# Patient Record
Sex: Male | Born: 1965 | Race: Black or African American | Hispanic: No | Marital: Married | Smoking: Never smoker
Health system: Southern US, Community
[De-identification: ages and names within clinical notes are randomized; demographics above are authoritative.]

## PROBLEM LIST (undated history)

## (undated) DIAGNOSIS — J45909 Unspecified asthma, uncomplicated: Secondary | ICD-10-CM

---

## 2004-12-31 ENCOUNTER — Emergency Department: Payer: Self-pay | Admitting: Emergency Medicine

## 2007-06-25 ENCOUNTER — Emergency Department: Payer: Self-pay | Admitting: Emergency Medicine

## 2008-04-20 ENCOUNTER — Emergency Department: Payer: Self-pay | Admitting: Emergency Medicine

## 2009-03-31 ENCOUNTER — Emergency Department: Payer: Self-pay | Admitting: Emergency Medicine

## 2011-05-12 ENCOUNTER — Emergency Department: Payer: Self-pay | Admitting: Emergency Medicine

## 2012-11-16 ENCOUNTER — Emergency Department: Payer: Self-pay | Admitting: Emergency Medicine

## 2014-09-10 ENCOUNTER — Emergency Department
Admit: 2014-09-10 | Disposition: A | Discharge status: Home / Self Care | Payer: Self-pay | Admitting: Emergency Medicine

## 2014-09-10 LAB — CBC WITH DIFFERENTIAL/PLATELET
Basophil #: 0.1 10*3/uL (ref 0.0–0.1)
Basophil %: 0.9 %
EOS ABS: 0.1 10*3/uL (ref 0.0–0.7)
Eosinophil %: 0.6 %
HCT: 52.3 % — ABNORMAL HIGH (ref 40.0–52.0)
HGB: 17.5 g/dL (ref 13.0–18.0)
LYMPHS ABS: 2.4 10*3/uL (ref 1.0–3.6)
Lymphocyte %: 22.3 %
MCH: 29.5 pg (ref 26.0–34.0)
MCHC: 33.5 g/dL (ref 32.0–36.0)
MCV: 88 fL (ref 80–100)
MONO ABS: 1.1 x10 3/mm — AB (ref 0.2–1.0)
Monocyte %: 10 %
Neutrophil #: 7.3 10*3/uL — ABNORMAL HIGH (ref 1.4–6.5)
Neutrophil %: 66.2 %
PLATELETS: 174 10*3/uL (ref 150–440)
RBC: 5.92 10*6/uL — ABNORMAL HIGH (ref 4.40–5.90)
RDW: 14 % (ref 11.5–14.5)
WBC: 11 10*3/uL — AB (ref 3.8–10.6)

## 2014-09-10 LAB — URINALYSIS, COMPLETE
BACTERIA: NONE SEEN
BILIRUBIN, UR: NEGATIVE
Blood: NEGATIVE
GLUCOSE, UR: NEGATIVE mg/dL (ref 0–75)
Ketone: NEGATIVE
LEUKOCYTE ESTERASE: NEGATIVE
Nitrite: NEGATIVE
PH: 5 (ref 4.5–8.0)
Protein: NEGATIVE
RBC,UR: 3 /HPF (ref 0–5)
Specific Gravity: 1.024 (ref 1.003–1.030)
WBC UR: 1 /HPF (ref 0–5)

## 2014-09-10 LAB — COMPREHENSIVE METABOLIC PANEL
ALBUMIN: 4.3 g/dL
Alkaline Phosphatase: 70 U/L
Anion Gap: 8 (ref 7–16)
BUN: 15 mg/dL
Bilirubin,Total: 0.5 mg/dL
Calcium, Total: 9 mg/dL
Chloride: 102 mmol/L
Co2: 26 mmol/L
Creatinine: 1.03 mg/dL
EGFR (African American): >60
EGFR (Non-African Amer.): >60
GLUCOSE: 134 mg/dL — AB
Potassium: 3.6 mmol/L
SGOT(AST): 24 U/L
SGPT (ALT): 21 U/L
SODIUM: 136 mmol/L
TOTAL PROTEIN: 7.3 g/dL

## 2014-09-10 LAB — TROPONIN I: Troponin-I: <0.03 ng/mL

## 2014-09-10 LAB — LIPASE, BLOOD: LIPASE: 57 U/L — AB

## 2016-06-03 ENCOUNTER — Emergency Department: Payer: Self-pay

## 2016-06-03 ENCOUNTER — Emergency Department
Admission: EM | Admit: 2016-06-03 | Discharge: 2016-06-03 | Disposition: A | Discharge status: Home / Self Care | Payer: Self-pay | Attending: Emergency Medicine | Admitting: Emergency Medicine

## 2016-06-03 ENCOUNTER — Encounter: Payer: Self-pay | Admitting: Emergency Medicine

## 2016-06-03 DIAGNOSIS — J45909 Unspecified asthma, uncomplicated: Secondary | ICD-10-CM | POA: Insufficient documentation

## 2016-06-03 DIAGNOSIS — J209 Acute bronchitis, unspecified: Secondary | ICD-10-CM | POA: Insufficient documentation

## 2016-06-03 HISTORY — DX: Unspecified asthma, uncomplicated: J45.909

## 2016-06-03 MED ORDER — ALBUTEROL SULFATE HFA 108 (90 BASE) MCG/ACT IN AERS: 2.0000 | INHALATION_SPRAY | Freq: Four times a day (QID) | RESPIRATORY_TRACT | 2 refills | Status: DC | PRN

## 2016-06-03 MED ORDER — IPRATROPIUM-ALBUTEROL 0.5-2.5 (3) MG/3ML IN SOLN
3.0000 mL | Freq: Once | RESPIRATORY_TRACT | Status: AC
  Administered 2016-06-03: 3 mL via RESPIRATORY_TRACT
  Filled 2016-06-03: qty 3

## 2016-06-03 MED ORDER — AZITHROMYCIN 250 MG PO TABS: ORAL_TABLET | ORAL | 0 refills | Status: AC

## 2016-06-03 NOTE — ED Triage Notes (Signed)
Patient c/o cough, congestion, itchy eyes, and runny nose x 1 week. Patient states that he has tried OTC medications without relief. Patient denies fever or chills.

## 2016-06-03 NOTE — ED Notes (Signed)
Pt reports cough sxs for the past week and a half, productive cough, nasal drainage, watering eyes. Denies any fevers. Using OTC medications without relief.

## 2016-06-03 NOTE — ED Notes (Signed)
Patient transported to X-ray 

## 2016-06-03 NOTE — ED Provider Notes (Signed)
[facility] Emergency Department Provider Note  ____________________________________________  Time seen: Approximately 2:51 PM  I have reviewed the triage vital signs and the nursing notes.   HISTORY  Chief Complaint Cough   HPI Hector MorgansMarvin S Robbins is a 50 y.o. male with a history of asthma presenting to the emergency department with productive cough for the past week. Patient utilizes albuterol PRN for asthma. Additional symptoms include purulent sputum production with cough, congestion and clear rhinorrhea. Patient states that he has been occasionally short of breath and cough has kept him awake at night. He denies fever. Denies chest pain, nausea, vomiting, abdominal pain, dysuria and hematuria. He is out of albuterol. No recent travel. Patient works as an Journalist, newspaperauto mechanic.   Past Medical History:  Diagnosis Date  . Asthma     There are no active problems to display for this patient.   No past surgical history on file.  Prior to Admission medications   Medication Sig Start Date End Date Taking? Authorizing Provider  albuterol (PROVENTIL HFA;VENTOLIN HFA) 108 (90 Base) MCG/ACT inhaler Inhale 2 puffs into the lungs every 6 (six) hours as needed for wheezing or shortness of breath. 06/03/16   Orvil FeilJaclyn M Flois [place], PA-C  azithromycin (ZITHROMAX Z-PAK) 250 MG tablet Take 2 tablets (500 mg) on  Day 1,  followed by 1 tablet (250 mg) once daily on Days 2 through 5. 06/03/16 06/08/16  Orvil FeilJaclyn M Yuko [place], PA-C    Allergies Patient has no known allergies.  No family history on file.  Social History Social History  Substance Use Topics  . Smoking status: Never Smoker  . Smokeless tobacco: Never Used  . Alcohol use No     Review of Systems  Constitutional: No fever/chills Eyes: No visual changes. No discharge ENT: Patient has productive cough, purulent sputum production and congestion. Cardiovascular: no chest pain. Respiratory: He has been wheezing. Gastrointestinal:  No abdominal pain.  No nausea, no vomiting.  No diarrhea.  No constipation. Musculoskeletal: Negative for musculoskeletal pain. Skin: Negative for rash, abrasions, lacerations, ecchymosis. Neurological: Negative for headaches, focal weakness or numbness. 10-point ROS otherwise negative.  ____________________________________________   PHYSICAL EXAM:  VITAL SIGNS: ED Triage Vitals  Enc Vitals Group     BP 06/03/16 1348 138/78     Pulse Rate 06/03/16 1348 89     Resp 06/03/16 1348 16     Temp 06/03/16 1348 98.3 F (36.8 C)     Temp Source 06/03/16 1348 Oral     SpO2 06/03/16 1348 97 %     Weight 06/03/16 1349 265 lb (120.2 kg)     Height 06/03/16 1349 5\' 9"  (1.753 m)     Head Circumference --      Peak Flow --      Pain Score --      Pain Loc --      Pain Edu? --      Excl. in GC? --      Constitutional: Alert and oriented. Well appearing and in no acute distress. Eyes: Conjunctivae are normal. PERRL. EOMI. Head: Atraumatic. ENT:      Ears: Tympanic membranes are pearly bilaterally without erythema or purulent exudate. Bony landmarks visualized bilaterally.      Nose: Nasal turbinates are edematous.      Mouth/Throat: Mucous membranes are moist. Posterior pharynx is without erythema, tonsillar hypertrophy or tonsillar exudate.  Neck: No stridor. FROM.  Cardiovascular: Normal rate, regular rhythm. Normal S1 and S2.  Good peripheral circulation. Respiratory: Normal  respiratory effort without tachypnea or retractions. Patient has wheezing auscultated in the right lung base. Wheezing resolved to auscultation after DuoNeb. Skin:  Skin is warm, dry and intact. No rash noted. Psychiatric: Mood and affect are normal. Speech and behavior are normal. Patient exhibits appropriate insight and judgement.   ____________________________________________   LABS (all labs ordered are listed, but only abnormal results are displayed)  Labs Reviewed - No data to  display ____________________________________________  EKG   ____________________________________________  RADIOLOGY Geraldo PitterI, Emeli Goguen M Maverick Dieudonne, personally viewed and evaluated these images (plain radiographs) as part of my medical decision making, as well as reviewing the written report by the radiologist.  Dg Chest 2 View  Result Date: 06/03/2016 CLINICAL DATA:  Productive cough with congestion and dizziness for 1 week. Social smoker. EXAM: CHEST  2 VIEW COMPARISON:  04/20/2008. FINDINGS: The heart size and mediastinal contours are normal. The lungs are clear. There is no pleural effusion or pneumothorax. No acute osseous findings are identified. Stable degenerative changes within the thoracic spine. IMPRESSION: Stable chest.  No active cardiopulmonary process. Electronically Signed   By: Carey BullocksWilliam  Veazey M.D.   On: 06/03/2016 15:12    ____________________________________________    PROCEDURES  Procedure(s) performed:    Procedures  Medications  ipratropium-albuterol (DUONEB) 0.5-2.5 (3) MG/3ML nebulizer solution 3 mL (3 mLs Nebulization Given 06/03/16 1505)     ____________________________________________   INITIAL IMPRESSION / ASSESSMENT AND PLAN / ED COURSE  Pertinent labs & imaging results that were available during my care of the patient were reviewed by me and considered in my medical decision making (see chart for details).  Review of the [place] CSRS was performed in accordance of the [facility] prior to dispensing any controlled drugs.  Clinical Course     Assessment and plan:  Acute Bronchitis Patient has had a productive cough with purulent sputum production for the past week. DG chest does not reveal consolidations or findings consistent with pneumonia. Right lung base wheezing improved to auscultation after DuoNeb treatment. Patient was discharged with azithromycin and albuterol. Patient education was provided regarding albuterol use. Patient was advised to follow-up with his  primary care provider in one week. Vital signs are reassuring at this time. All patient questions were answered.  __________________________________________  FINAL CLINICAL IMPRESSION(S) / ED DIAGNOSES  Final diagnoses:  Acute bronchitis, unspecified organism      NEW MEDICATIONS STARTED DURING THIS VISIT:  Discharge Medication List as of 06/03/2016  3:34 PM    START taking these medications   Details  azithromycin (ZITHROMAX Z-PAK) 250 MG tablet Take 2 tablets (500 mg) on  Day 1,  followed by 1 tablet (250 mg) once daily on Days 2 through 5., Print            This chart was dictated using voice recognition software/Dragon. Despite best efforts to proofread, errors can occur which can change the meaning. Any change was purely unintentional.    Orvil FeilJaclyn M Cuong Moorman, PA-C 06/03/16 1722    Jene Everyobert Kinner, MD 06/05/16 [phone]

## 2016-07-24 ENCOUNTER — Encounter: Payer: Self-pay | Admitting: Emergency Medicine

## 2016-07-24 ENCOUNTER — Emergency Department
Admission: EM | Admit: 2016-07-24 | Discharge: 2016-07-24 | Disposition: A | Discharge status: Home / Self Care | Payer: Self-pay | Attending: Emergency Medicine | Admitting: Emergency Medicine

## 2016-07-24 ENCOUNTER — Emergency Department: Payer: Self-pay

## 2016-07-24 DIAGNOSIS — J45909 Unspecified asthma, uncomplicated: Secondary | ICD-10-CM | POA: Insufficient documentation

## 2016-07-24 DIAGNOSIS — N50812 Left testicular pain: Secondary | ICD-10-CM

## 2016-07-24 DIAGNOSIS — N451 Epididymitis: Secondary | ICD-10-CM | POA: Insufficient documentation

## 2016-07-24 DIAGNOSIS — Z79899 Other long term (current) drug therapy: Secondary | ICD-10-CM | POA: Insufficient documentation

## 2016-07-24 LAB — URINALYSIS, COMPLETE (UACMP) WITH MICROSCOPIC
BACTERIA UA: NONE SEEN
BILIRUBIN URINE: NEGATIVE
Glucose, UA: NEGATIVE mg/dL
Hgb urine dipstick: NEGATIVE
KETONES UR: NEGATIVE mg/dL
Nitrite: NEGATIVE
PH: 8 (ref 5.0–8.0)
PROTEIN: NEGATIVE mg/dL
SQUAMOUS EPITHELIAL / LPF: NONE SEEN
Specific Gravity, Urine: 1.017 (ref 1.005–1.030)

## 2016-07-24 MED ORDER — IBUPROFEN 800 MG PO TABS
800.0000 mg | ORAL_TABLET | ORAL | Status: AC
  Administered 2016-07-24: 800 mg via ORAL
  Filled 2016-07-24: qty 1

## 2016-07-24 MED ORDER — LEVOFLOXACIN 500 MG PO TABS: 500.0000 mg | ORAL_TABLET | Freq: Every day | ORAL | 0 refills | Status: AC

## 2016-07-24 MED ORDER — LEVOFLOXACIN 500 MG PO TABS
500.0000 mg | ORAL_TABLET | Freq: Once | ORAL | Status: AC
  Administered 2016-07-24: 500 mg via ORAL
  Filled 2016-07-24: qty 1

## 2016-07-24 NOTE — Discharge Instructions (Signed)
You have been seen in the Emergency Department (ED) today for pain when urinating.  Your workup today suggests that you have a urinary tract infection (UTI).  Please take your antibiotic as prescribed and over-the-counter pain medication (Tylenol or Motrin) as needed, but no more than recommended on the label instructions.  Drink PLENTY of fluids.  Call your regular doctor or Urology to schedule the next available appointment to follow up on today?s ED visit, or return immediately to the ED if your pain worsens, you have decreased urine production, develop fever, persistent vomiting, severe pain or swelling in the testicles, or other symptoms that concern you.

## 2016-07-24 NOTE — ED Provider Notes (Signed)
[facility] Emergency Department Provider Note  ____________________________________________   First MD Initiated Contact with Patient 07/24/16 1709     (approximate)  I have reviewed the triage vital signs and the nursing notes.   HISTORY  Chief Complaint Testicle Pain   HPI Hector MorgansMarvin S Robbins is a 51 y.o. male reports no significant medical history of an asthma  Yesterday no slight discomfort along the back of his left testicle, then when he woke up this morning he noticed a appreciable increase in pain, the point was difficulty to bed because of pain at the base of the left testicle. However, after taking 2 Profen tablets at home his pain started to ease up but he knows he is feeling like he had a slight temperature. He is also noticed yesterday when he was urinating that he had kind of a strange burning feeling when he urinated.  No nausea vomiting. No chest pain or shortness of breath. Reports he is concerned that he might have some difficulty with being at work the next couple days because of pain in the testicle. No groin swelling. The left testicle has looked slightly swollen  Sexual active only with one partner for many years. Denies any history of STDs   Past Medical History:  Diagnosis Date  . Asthma     There are no active problems to display for this patient.   History reviewed. No pertinent surgical history.  Prior to Admission medications   Medication Sig Start Date End Date Taking? Authorizing Provider  albuterol (PROVENTIL HFA;VENTOLIN HFA) 108 (90 Base) MCG/ACT inhaler Inhale 2 puffs into the lungs every 6 (six) hours as needed for wheezing or shortness of breath. 06/03/16   Orvil FeilJaclyn M [place], PA-C  levofloxacin (LEVAQUIN) 500 MG tablet Take 1 tablet (500 mg total) by mouth daily. 07/24/16 08/08/16  Sharyn CreamerMark Quale, MD    Allergies Patient has no known allergies.  No family history on file.  Social History Social History  Substance Use  Topics  . Smoking status: Never Smoker  . Smokeless tobacco: Never Used  . Alcohol use No    Review of Systems Constitutional: Feeling a slight fever, no chills Eyes: No visual changes. ENT: No sore throat. Cardiovascular: Denies chest pain. Respiratory: Denies shortness of breath. Gastrointestinal: No abdominal pain.  No nausea, no vomiting.   Genitourinary: See history of present illness Musculoskeletal: Negative for back pain. Skin: Negative for rash. Neurological: Negative for headaches, focal weakness or numbness.  No pain in the buttock. No pain with defecation.  10-point ROS otherwise negative.  ____________________________________________   PHYSICAL EXAM:  VITAL SIGNS: ED Triage Vitals [07/24/16 1411]  Enc Vitals Group     BP 139/70     Pulse Rate 89     Resp 18     Temp (!) 100.4 F (38 C)     Temp Source Oral     SpO2 97 %     Weight 260 lb (117.9 kg)     Height 5\' 9"  (1.753 m)     Head Circumference      Peak Flow      Pain Score 9     Pain Loc      Pain Edu?      Excl. in GC?     Constitutional: Alert and oriented. Well appearing and in no acute distress.Ambulatory, very pleasant. Eyes: Conjunctivae are normal. PERRL. EOMI. Head: Atraumatic. Nose: No congestion/rhinnorhea. Mouth/Throat: Mucous membranes are moist.  Oropharynx non-erythematous. Neck: No stridor.  Cardiovascular: Normal rate, regular rhythm. Grossly normal heart sounds.  Good peripheral circulation. Respiratory: Normal respiratory effort.  No retractions. Lungs CTAB. Gastrointestinal: Soft and nontender. No distention.  Right testicle normal nontender and descended. Left testicle descended, minimally edematous, with focal tenderness along the posterior portion/epididymis reproducing the patient's pain. No groin masses or hernia noted. Penis normal. No scrotal erythema or erythema into the perineum. Musculoskeletal: No lower extremity tenderness nor edema.  No joint  effusions. Neurologic:  Normal speech and language. No gross focal neurologic deficits are appreciated.  Skin:  Skin is warm, dry and intact. No rash noted. Psychiatric: Mood and affect are normal. Speech and behavior are normal.  ____________________________________________   LABS (all labs ordered are listed, but only abnormal results are displayed)  Labs Reviewed  URINALYSIS, COMPLETE (UACMP) WITH MICROSCOPIC - Abnormal; Notable for the following:       Result Value   Color, Urine YELLOW (*)    APPearance CLEAR (*)    Leukocytes, UA TRACE (*)    All other components within normal limits  URINE CULTURE   ____________________________________________  EKG   ____________________________________________  RADIOLOGY  US Scrotum  Result Date: 07/24/2016 CLINICAL DATA:  51 year old male with acute left testicular pain starting this morning. Burning with urination for 2-3 days. Initial encounter. EXAM: SCROTAL ULTRASOUND DOPPLER ULTRASOUND OF THE TESTICLES TECHNIQUE: Complete ultrasound examination of the testicles, epididymis, and other scrotal structures was performed. Color and spectral Doppler ultrasound were also utilized to evaluate blood flow to the testicles. COMPARISON:  CT Abdomen and Pelvis 09/10/2014 FINDINGS: Right testicle Measurements: 4.3 x 2.5 x 2.7 cm. No mass or microlithiasis visualized. Left testicle Measurements: 3.9 x 2.3 x 3.4 cm. 3 mm hypoechoic area at the upper pole of the testis has no associated vascularity (image 29). There are 1 or 2 other nearby a tiny cysts, perhaps this represents mild or early tubular ectasia of the [place] testis. Echogenicity is otherwise within normal limits. No mass or microlithiasis visualized. Right epididymis:  Normal in size and appearance. Left epididymis: The left epididymal head appears within normal limits however the body and tail of the epididymis appear somewhat enlarged and heterogeneous with some hypervascularity and correspond  to the clinical area of pain (images 76 through 83). Hydrocele:  None visualized. Varicocele: Positive left varicocele, including around the tail of the epididymis described above. Borderline to mild right side varicocele. Pulsed Doppler interrogation of both testes demonstrates normal low resistance arterial and venous waveforms bilaterally. IMPRESSION: 1. No evidence of testicular torsion. Negative testes; a 3 mm hypoechoic cyst at the upper pole of the left testicle appears inconsequential. 2. Heterogeneous tail of the left epididymis seems to correspond to the clinical area of pain, with adjacent left varicocele. Although the tail of the epididymis is mildly hypervascular, the epididymal head appears normal arguing against acute epididymitis. Electronically Signed   By: Odessa Fleming M.D.   On: 07/24/2016 15:14   [place] Art/ven Flow Abd Pelv Doppler  Result Date: 07/24/2016 CLINICAL DATA:  51 year old male with acute left testicular pain starting this morning. Burning with urination for 2-3 days. Initial encounter. EXAM: SCROTAL ULTRASOUND DOPPLER ULTRASOUND OF THE TESTICLES TECHNIQUE: Complete ultrasound examination of the testicles, epididymis, and other scrotal structures was performed. Color and spectral Doppler ultrasound were also utilized to evaluate blood flow to the testicles. COMPARISON:  CT Abdomen and Pelvis 09/10/2014 FINDINGS: Right testicle Measurements: 4.3 x 2.5 x 2.7 cm. No mass or microlithiasis visualized. Left testicle Measurements: 3.9 x 2.3  x 3.4 cm. 3 mm hypoechoic area at the upper pole of the testis has no associated vascularity (image 29). There are 1 or 2 other nearby a tiny cysts, perhaps this represents mild or early tubular ectasia of the [place] testis. Echogenicity is otherwise within normal limits. No mass or microlithiasis visualized. Right epididymis:  Normal in size and appearance. Left epididymis: The left epididymal head appears within normal limits however the body and tail of the  epididymis appear somewhat enlarged and heterogeneous with some hypervascularity and correspond to the clinical area of pain (images 76 through 83). Hydrocele:  None visualized. Varicocele: Positive left varicocele, including around the tail of the epididymis described above. Borderline to mild right side varicocele. Pulsed Doppler interrogation of both testes demonstrates normal low resistance arterial and venous waveforms bilaterally. IMPRESSION: 1. No evidence of testicular torsion. Negative testes; a 3 mm hypoechoic cyst at the upper pole of the left testicle appears inconsequential. 2. Heterogeneous tail of the left epididymis seems to correspond to the clinical area of pain, with adjacent left varicocele. Although the tail of the epididymis is mildly hypervascular, the epididymal head appears normal arguing against acute epididymitis. Electronically Signed   By: Odessa Fleming M.D.   On: 07/24/2016 15:14    ____________________________________________   PROCEDURES  Procedure(s) performed: None  Procedures  Critical Care performed: No  ____________________________________________   INITIAL IMPRESSION / ASSESSMENT AND PLAN / ED COURSE  Pertinent labs & imaging results that were available during my care of the patient were reviewed by me and considered in my medical decision making (see chart for details).  Most consistent with epididymitis. Uncomplicated. Start Levaquin, advised on careful and close follow-up. Pain well controlled after taking ibuprofen at home.  Return precautions and treatment recommendations and follow-up discussed with the patient who is agreeable with the plan.       ____________________________________________   FINAL CLINICAL IMPRESSION(S) / ED DIAGNOSES  Final diagnoses:  Testicular pain, left  Epididymitis, left      NEW MEDICATIONS STARTED DURING THIS VISIT:  Discharge Medication List as of 07/24/2016  5:33 PM    START taking these medications    Details  levofloxacin (LEVAQUIN) 500 MG tablet Take 1 tablet (500 mg total) by mouth daily., Starting Sun 07/24/2016, Until Mon 08/08/2016, Print         Note:  This document was prepared using Dragon voice recognition software and may include unintentional dictation errors.     Sharyn Creamer, MD 07/24/16 [phone]

## 2016-07-24 NOTE — ED Notes (Signed)
Test results reviewed. Awaiting bed in CP[facility].

## 2016-07-24 NOTE — ED Triage Notes (Signed)
Patient presents to the ED with left testicular pain and tenderness that began this morning.  Patient reports having difficulty sitting in church due to discomfort.  Patient also has a fever at this time. Patient denies any cold/flu symptoms.  Patient denies any other complaint.

## 2016-07-27 LAB — URINE CULTURE
Culture: 30000 — AB
SPECIAL REQUESTS: NORMAL

## 2016-11-21 ENCOUNTER — Other Ambulatory Visit: Payer: Self-pay | Admitting: Family Medicine

## 2016-11-21 ENCOUNTER — Ambulatory Visit (INDEPENDENT_AMBULATORY_CARE_PROVIDER_SITE_OTHER): Payer: Self-pay | Admitting: Family Medicine

## 2016-11-21 ENCOUNTER — Encounter: Payer: Self-pay | Admitting: Family Medicine

## 2016-11-21 VITALS — BP 132/88 | HR 72 | Temp 98.4°F | Resp 16 | Ht 70.0 in | Wt 282.6 lb

## 2016-11-21 DIAGNOSIS — Z6841 Body Mass Index (BMI) 40.0 and over, adult: Secondary | ICD-10-CM

## 2016-11-21 DIAGNOSIS — Z125 Encounter for screening for malignant neoplasm of prostate: Secondary | ICD-10-CM

## 2016-11-21 DIAGNOSIS — R030 Elevated blood-pressure reading, without diagnosis of hypertension: Secondary | ICD-10-CM

## 2016-11-21 DIAGNOSIS — R7989 Other specified abnormal findings of blood chemistry: Secondary | ICD-10-CM

## 2016-11-21 DIAGNOSIS — R7309 Other abnormal glucose: Secondary | ICD-10-CM

## 2016-11-21 DIAGNOSIS — J45909 Unspecified asthma, uncomplicated: Secondary | ICD-10-CM | POA: Insufficient documentation

## 2016-11-21 DIAGNOSIS — Z1211 Encounter for screening for malignant neoplasm of colon: Secondary | ICD-10-CM

## 2016-11-21 DIAGNOSIS — R29818 Other symptoms and signs involving the nervous system: Secondary | ICD-10-CM | POA: Insufficient documentation

## 2016-11-21 DIAGNOSIS — Z23 Encounter for immunization: Secondary | ICD-10-CM

## 2016-11-21 DIAGNOSIS — Z114 Encounter for screening for human immunodeficiency virus [HIV]: Secondary | ICD-10-CM

## 2016-11-21 DIAGNOSIS — Z7689 Persons encountering health services in other specified circumstances: Secondary | ICD-10-CM

## 2016-11-21 DIAGNOSIS — J4520 Mild intermittent asthma, uncomplicated: Secondary | ICD-10-CM

## 2016-11-21 DIAGNOSIS — Z0000 Encounter for general adult medical examination without abnormal findings: Secondary | ICD-10-CM

## 2016-11-21 DIAGNOSIS — K644 Residual hemorrhoidal skin tags: Secondary | ICD-10-CM

## 2016-11-21 DIAGNOSIS — R799 Abnormal finding of blood chemistry, unspecified: Secondary | ICD-10-CM

## 2016-11-21 NOTE — Assessment & Plan Note (Signed)
Mildly elevated initial BP but still normal repeat manual check improved. In Pre-HTN range. - Home BP readings - none available  No known complications    Plan: 1. No medications indicated currently 2. Encourage improved lifestyle - low sodium diet, start regular exercise 3. Start monitor BP outside office, purchase BP cuff, bring readings to next visit, if persistently >140/90 or new symptoms notify office sooner 4. Follow-up 6 weeks Annual Physical + labs

## 2016-11-21 NOTE — Assessment & Plan Note (Signed)
Average risk patient, without prior screening PSA or DRE - Clinically asymptomatic  Plan: 1. Reviewed prostate cancer screening guidelines and risks including potential prostate biopsy if abnormal PSA 2. DRE done today unremarkable for prosate 3. Check PSA on upcoming labs for annual physical, likely monitor yearly

## 2016-11-21 NOTE — Assessment & Plan Note (Signed)
Due for routine colon cancer screening. Never had colonoscopy (interested), no family history colon cancer. - Discussion today about recommendations for either Colonoscopy or Cologuard screening, benefits and risks of screening, interested in proceeding with Colonoscopy - Ordered referral to [facility] today

## 2016-11-21 NOTE — Assessment & Plan Note (Signed)
Stable, controlled mild intermittent asthma, usually trigger season change / allergy Rarely on albuterol No recent exacerbations or other red flags Continue Albuterol PRN No maintenance therapy at this time. Consider singulair in future if needed Follow-up PRN

## 2016-11-21 NOTE — Assessment & Plan Note (Signed)
Suspected OSA in patient with reported / witnessed sleep apnea High risk based on STOP-Bang OSA scoring = 6, patient with obesity Also with Pre-HTN concern may be secondary to OSA untreated  Plan: 1. Discussion on proceeding with first sleep study - requested that patient check with insurance to determine cost / coverage preference, sleep center vs in home sleep study, will order at next visit based on report. Also will perform further eval with ESS questionnaire, neck circumference measurement

## 2016-11-21 NOTE — Assessment & Plan Note (Addendum)
Continued abnormal weight gain in setting morbid obesity Risk with Pre-HTN, unknown A1c and lipids Counseling on lifestyle modifications, diet, exercise, weight loss First goal for drinking more water, discontinue or significantly limit juice/soda

## 2016-11-21 NOTE — Patient Instructions (Addendum)
Thank you for coming to the clinic today.  1.   You do have a small external posterior hemorrhoid. Recommend continue cream, wipes as needed. We can rx Suppository if you prefer, this is more for internal treatment. - May have pain, bleeding - Worse with straining and constipation. - Try to increase fiber in diet and increase water intake for good hydration - Colonoscopy will also confirm this  For diet changes - try to really increase water, and limit all other drinks such as sugary juice, soda. IN future we can pick other goals to work  I am concerned about Obstructive Sleep Apnea - OSA. You likely need a sleep study to diagnose this. And possibly treatment in future with CPAP. - Call your insurance company to find out cost / coverage for Sleep Study (Polysomnography) either IN SLEEP CENTER / LAB or... IN HOME sleep study - Let me know next time and we can order it  For nutritionist if interested in this in future -  [facility]   Address: [address], [place], [place] [address] Phone: [phone]  ------------------  Colon Cancer Screening: - For all adults age 15+ routine colon cancer screening is highly recommended.     - Recent guidelines from American Cancer Society recommend starting age of 51 - Early detection of colon cancer is important, because often there are no warning signs or symptoms, also if found early usually it can be cured. Late stage is hard to treat. - Special circumstances in patients with early family history of colon cancer, we recommend colonoscopy at a younger age (usually 10 years before the family member was diagnosed).  - Colonoscopy is the best test for colon cancer because it involves direct visualization and immediate treatment (compared to other imaging studies or stool cards to test for blood, that will require you to eventually get a colonoscopy if they are abnormal). - Also to consider, Cologuard is an excellent alternative for  screening test for Colon Cancer. It is highly sensitive for detecting DNA of colon cancer from even the earliest stages. Also, there is NO bowel prep required. ------------------------- I have placed a referral to Gastroenterology. They will contact you with initial appointment and bowel prep information.  If you don't hear back from anyone, call in 2 weeks to check status  [facility]) [address] [place], [place] [address] Phone: [phone]  Wyline MoodKiran Anna, MD (accepting new patients)  Please schedule a Follow-up Appointment to: Return in about 6 weeks (around 01/02/2017) for Annual Physical.  If you have any other questions or concerns, please feel free to call the clinic or send a message through MyChart. You may also schedule an earlier appointment if necessary.  Additionally, you may be receiving a survey about your experience at our clinic within a few days to 1 week by e-mail or mail. We value your feedback.  Saralyn PilarAlexander Karamalegos, DO [facility]

## 2016-11-21 NOTE — Progress Notes (Signed)
Subjective:    Patient ID: Hector Robbins, male    DOB: 09-Sep-1965, 51 y.o.   MRN: [id number]  Hector Robbins is a 51 y.o. male presenting on 11/21/2016 for Establish Care and Hemorrhoids  Previously has been without PCP for many years (>10 or more), he had been seeking medical care at Urgent Care and ED PRN.  HPI   He has not had an Annual Physical in many years.  Pre-HTN Reports no known prior diagnosis of HTN or Pre-HTN. Does not have a BP cuff at home, but considering Current Meds - None (never on any medications)  History of Abnormal Glucose: - Last recorded blood sugar non fasting in ED in 2016, at 134. No prior screening or testing for Diabetes. He is interested in A1c. Some family history of DM  MORBID OBESITY BMI >40 - Reports he admits he is "overweight", weight gain +17-20 lbs in 6 months Lifestyle: - Diet: Reports he does admit problems with meal choices and portions, mostly fried food, also admits snacks and chips, drinks mostly water, occasional juice including V8 Splash - Exercise: He is active walking at work, at home he does some yardwork at home, also plays with children outdoors, soccer and basketball. He does not have regular exercise routine  Asthma, Mild intermittent - Reports chronic history with occasional flares, usually weather related with change in temperature from Summer to Fall, or Winter to Spring. He describes seasonal allergies related to asthma and may trigger wheezing and he rarely has to use Albuterol. - He rarely takes allergy medicine PRN  Hemorrhoids: - Reported new onset problem for past 3 weeks, describes symptoms with mild rectal burning and irritation at times, he tried topical cream and wipes some improvement, but he has not seen any blood - Tries to stay hydrated - He denies significant constipation or straining with bowel movement. But admits maybe x 2 episodes for past 2 weeks  Suspected Obstructive Sleep Apnea: - When prompted in  ROS, he admitted to sleep apnea events, describes occasionally or rarely he could wake up with shortness of breath, and has admitted wife witnessed apnea event overnight before. He states he would like to pursue further sleep study testing. Has not had one before. His sister has OSA and is on CPAP  Health Maintenance: - Screening Colon and Prostate CA: He denies any known family history of prostate/colon cancer. He is interested to pursue Colonoscopy referral, and PSA lab test screening. 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 Asthma    History reviewed. No pertinent surgical history. Social History   Social History  . Marital status: Married    Spouse name: N/A  . Number of children: 3  .  Years of education: High School   Occupational History  . Mechanic    Social History Main Topics  . Smoking status: Never Smoker  . Smokeless tobacco: Never Used  . Alcohol use 0.6 oz/week    1 Cans of beer per week     Comment: 1-2 drinks not every weekend  . Drug use: No  . Sexual activity: Yes    Partners: Female   Other Topics Concern  . Not on file   Social History Narrative  . No narrative on file   Family History  Problem Relation Age of Onset  . Diabetes Mother   . Hypertension Mother   . Dementia Father   . Hypertension Sister   . Obstructive Sleep Apnea Sister   . Cancer  Brother 64       Nasal Cancer  . Lymphoma Brother 59       Lymphoma  . Hypertension Brother   . Prostate cancer Neg Hx   . Colon cancer Neg Hx    Current Outpatient Prescriptions on File Prior to Visit  Medication Sig  . albuterol (PROVENTIL HFA;VENTOLIN HFA) 108 (90 Base) MCG/ACT inhaler Inhale 2 puffs into the lungs every 6 (six) hours as needed for wheezing or shortness of breath.   No current facility-administered medications on file prior to visit.     Review of Systems  Constitutional: Negative for activity change, appetite change, chills, diaphoresis, fatigue, fever and unexpected weight change.  HENT: Negative for congestion, hearing loss, postnasal drip and sinus pressure.   Eyes: Negative for visual disturbance.  Respiratory: Positive for apnea (reported witnessed sleep). Negative for cough, choking, chest tightness, shortness of breath and wheezing.   Cardiovascular: Negative for chest pain, palpitations and leg swelling.  Gastrointestinal: Positive for rectal pain (hemorrhoid). Negative for abdominal distention, abdominal pain, anal bleeding, blood in stool, constipation, diarrhea, nausea and vomiting.  Endocrine: Negative for cold intolerance and polyuria.  Genitourinary: Negative for decreased urine volume, difficulty urinating, dysuria, frequency, hematuria and testicular pain.  Musculoskeletal: Negative for arthralgias and neck pain.  Skin: Negative for rash.  Allergic/Immunologic: Positive for environmental allergies.  Neurological: Negative for dizziness, weakness, light-headedness, numbness and headaches.  Hematological: Negative for adenopathy.  Psychiatric/Behavioral: Negative for behavioral problems, dysphoric mood and sleep disturbance. The patient is not nervous/anxious.    Per HPI unless specifically indicated above     Objective:    BP 132/88 (BP Location: Left Arm, Cuff Size: Large)   Pulse 72   Temp 98.4 F (36.9 C) (Oral)   Resp 16   Ht 5\' 10"   (1.778 m)   Wt 282 lb 9.6 oz (128.2 kg)   BMI 40.55 kg/m   Wt Readings from Last 3 Encounters:  11/21/16 282 lb 9.6 oz (128.2 kg)  07/24/16 260 lb (117.9 kg)  06/03/16 265 lb (120.2 kg)    Physical Exam  Constitutional: He is oriented to person, place, and time. He appears well-developed and well-nourished. No distress.  Well-appearing, comfortable, cooperative, obese  HENT:  Head: Normocephalic and atraumatic.  Mouth/Throat: Oropharynx is clear and moist.  Mallampati Score 3 - Visualization of only base of uvula  Eyes: Conjunctivae are normal. Right eye exhibits no discharge. Left eye exhibits no discharge.  Neck: Normal range of motion. Neck supple. No thyromegaly present.  Cardiovascular: Normal rate, regular rhythm, normal heart sounds and intact distal pulses.   No murmur heard. Pulmonary/Chest: Effort normal and breath sounds normal. No respiratory distress. He has no wheezes. He has no  rales.  Good air movement.  Genitourinary:  Genitourinary Comments: Rectal/DRE: External rectal with moderate non swollen or inflamed posterior R external hemorrhoidal tissue. No evidence of fissures. No bleeding and non tender. DRE with palpation of normal prostate without enlargement or abnormality, no nodules. Exam does seem to reproduce some mild rectal pain he has been experiencing.  Musculoskeletal: He exhibits no edema.  Lymphadenopathy:    He has no cervical adenopathy.  Neurological: He is alert and oriented to person, place, and time.  Skin: Skin is warm and dry. No rash noted. He is not diaphoretic. No erythema.  Psychiatric: He has a normal mood and affect. His behavior is normal.  Nursing note and vitals reviewed.     Assessment & Plan:   Problem List Items Addressed This Visit    Suspected sleep apnea    Suspected OSA in patient with reported / witnessed sleep apnea High risk based on STOP-Bang OSA scoring = 6, patient with obesity Also with Pre-HTN concern may be secondary  to OSA untreated  Plan: 1. Discussion on proceeding with first sleep study - requested that patient check with insurance to determine cost / coverage preference, sleep center vs in home sleep study, will order at next visit based on report. Also will perform further eval with ESS questionnaire, neck circumference measurement      Screening for prostate cancer    Average risk patient, without prior screening PSA or DRE - Clinically asymptomatic  Plan: 1. Reviewed prostate cancer screening guidelines and risks including potential prostate biopsy if abnormal PSA 2. DRE done today unremarkable for prosate 3. Check PSA on upcoming labs for annual physical, likely monitor yearly      Screening for colon cancer    Due for routine colon cancer screening. Never had colonoscopy (interested), no family history colon cancer. - Discussion today about recommendations for either Colonoscopy or Cologuard screening, benefits and risks of screening, interested in proceeding with Colonoscopy - Ordered referral to [facility] today      Relevant Orders   Ambulatory referral to Gastroenterology   Pre-hypertension    Mildly elevated initial BP but still normal repeat manual check improved. In Pre-HTN range. - Home BP readings - none available  No known complications    Plan: 1. No medications indicated currently 2. Encourage improved lifestyle - low sodium diet, start regular exercise 3. Start monitor BP outside office, purchase BP cuff, bring readings to next visit, if persistently >140/90 or new symptoms notify office sooner 4. Follow-up 6 weeks Annual Physical + labs      Morbid obesity with BMI of 40.0-44.9, adult (HCC) - Primary    Continued abnormal weight gain in setting morbid obesity Risk with Pre-HTN, unknown A1c and lipids Counseling on lifestyle modifications, diet, exercise, weight loss First goal for drinking more water, discontinue or significantly limit juice/soda      External  hemorrhoids    Stable, non inflamed Left posterior external hemorrhoid. Does not appear thrombosed, without active bleeding. No appreciated deeper internal hemorrhoids. No evidence of anal fissure - Appropriate conservative therapy  Plan: 1. Reassurance, continue topical PRN flare 2. May notify office for rx Anusol-HC hydrocortisone 25mg  suppository BID for 7 days if needed 3. Avoid constipation and straining, recommend high fiber diet, improve hydration 4. May take NSAID PRN 5. Caution with prolonged sitting 6. Reviewed return criteria if not improving  Additionally - further evaluation via Colonoscopy in future      Asthma    Stable, controlled mild  intermittent asthma, usually trigger season change / allergy Rarely on albuterol No recent exacerbations or other red flags Continue Albuterol PRN No maintenance therapy at this time. Consider singulair in future if needed Follow-up PRN       Other Visit Diagnoses    Encounter to establish care with new doctor       Need for diphtheria-tetanus-pertussis (Tdap) vaccine       Relevant Orders   Tdap vaccine greater than or equal to 7yo IM (Completed)   Abnormal glucose       Prior elevated glucose, check A1c screening      No orders of the defined types were placed in this encounter.   Follow up plan: Return in about 6 weeks (around 01/02/2017) for Annual Physical.  Saralyn Pilar, DO [facility] 11/21/2016, 10:33 PM

## 2016-11-21 NOTE — Assessment & Plan Note (Addendum)
Stable, non inflamed Left posterior external hemorrhoid. Does not appear thrombosed, without active bleeding. No appreciated deeper internal hemorrhoids. No evidence of anal fissure - Appropriate conservative therapy  Plan: 1. Reassurance, continue topical PRN flare 2. May notify office for rx Anusol-HC hydrocortisone 25mg  suppository BID for 7 days if needed 3. Avoid constipation and straining, recommend high fiber diet, improve hydration 4. May take NSAID PRN 5. Caution with prolonged sitting 6. Reviewed return criteria if not improving  Additionally - further evaluation via Colonoscopy in future

## 2017-05-07 ENCOUNTER — Emergency Department
Admission: EM | Admit: 2017-05-07 | Discharge: 2017-05-07 | Disposition: A | Discharge status: Home / Self Care | Payer: BLUE CROSS/BLUE SHIELD | Attending: Emergency Medicine | Admitting: Emergency Medicine

## 2017-05-07 ENCOUNTER — Emergency Department: Payer: BLUE CROSS/BLUE SHIELD

## 2017-05-07 DIAGNOSIS — W19XXXA Unspecified fall, initial encounter: Secondary | ICD-10-CM | POA: Insufficient documentation

## 2017-05-07 DIAGNOSIS — J45909 Unspecified asthma, uncomplicated: Secondary | ICD-10-CM | POA: Insufficient documentation

## 2017-05-07 DIAGNOSIS — Y998 Other external cause status: Secondary | ICD-10-CM | POA: Insufficient documentation

## 2017-05-07 DIAGNOSIS — S63502A Unspecified sprain of left wrist, initial encounter: Secondary | ICD-10-CM | POA: Insufficient documentation

## 2017-05-07 DIAGNOSIS — Y939 Activity, unspecified: Secondary | ICD-10-CM | POA: Insufficient documentation

## 2017-05-07 DIAGNOSIS — Y929 Unspecified place or not applicable: Secondary | ICD-10-CM | POA: Insufficient documentation

## 2017-05-07 DIAGNOSIS — M67432 Ganglion, left wrist: Secondary | ICD-10-CM | POA: Insufficient documentation

## 2017-05-07 MED ORDER — TRAMADOL HCL 50 MG PO TABS: 50.0000 mg | ORAL_TABLET | Freq: Four times a day (QID) | ORAL | 0 refills | Status: DC | PRN

## 2017-05-07 NOTE — Discharge Instructions (Signed)
Follow up with the hand specialist for symptoms that are not improving over the next week or so.  Return to the ER for symptoms that change or worsen if unable to schedule an appointment.

## 2017-05-07 NOTE — ED Triage Notes (Signed)
Pt presents via POV c/o fall x1 week ago. Pt has swelling to left wrist per report without improvement.

## 2017-05-07 NOTE — ED Provider Notes (Signed)
[facility] Emergency Department Provider Note ____________________________________________  Time seen: Approximately 2:54 PM  I have reviewed the triage vital signs and the nursing notes.   HISTORY  Chief Complaint Fall    HPI Hector Robbins is a 51 y.o. male who presents to the emergency department for evaluation and treatment of left wrist pain after sustaining a mechanical, non-syncopal fall approximately 1 week ago.  He states that he has noticed a "lump" on his wrist since that time.  He has taken ibuprofen with no relief of pain.  He denies previous wrist injury. Past Medical History:  Diagnosis Date  . Asthma     Patient Active Problem List   Diagnosis Date Noted  . Asthma 11/21/2016  . Morbid obesity with BMI of 40.0-44.9, adult (HCC) 11/21/2016  . Suspected sleep apnea 11/21/2016  . Pre-hypertension 11/21/2016  . Screening for colon cancer 11/21/2016  . Screening for prostate cancer 11/21/2016  . External hemorrhoids 11/21/2016    History reviewed. No pertinent surgical history.  Prior to Admission medications   Medication Sig Start Date End Date Taking? Authorizing Provider  albuterol (PROVENTIL HFA;VENTOLIN HFA) 108 (90 Base) MCG/ACT inhaler Inhale 2 puffs into the lungs every 6 (six) hours as needed for wheezing or shortness of breath. 06/03/16   Orvil FeilWoods, Jaclyn M, PA-C  traMADol (ULTRAM) 50 MG tablet Take 1 tablet (50 mg total) by mouth every 6 (six) hours as needed. 05/07/17   Chinita Pesterriplett, Marquerite Forsman B, FNP    Allergies Patient has no known allergies.  Family History  Problem Relation Age of Onset  . Diabetes Mother   . Hypertension Mother   . Dementia Father   . Hypertension Sister   . Obstructive Sleep Apnea Sister   . Cancer Brother [id number]       Nasal Cancer  . Lymphoma Brother 59       Lymphoma  . Hypertension Brother   . Prostate cancer Neg Hx   . Colon cancer Neg Hx     Social History Social History   Tobacco Use  . Smoking  status: Never Smoker  . Smokeless tobacco: Never Used  Substance Use Topics  . Alcohol use: Yes    Alcohol/week: 0.6 oz    Types: 1 Cans of beer per week    Comment: 1-2 drinks not every weekend  . Drug use: No    Review of Systems Constitutional: Negative for recent illness. Cardiovascular: Negative for chest pain Respiratory: Negative for shortness of breath Musculoskeletal: Positive for left wrist pain Skin: Positive for raised lesion, negative for abrasion or wound Neurological: Negative for paresthesias  ____________________________________________   PHYSICAL EXAM:  VITAL SIGNS: ED Triage Vitals  Enc Vitals Group     BP 05/07/17 1322 (!) 148/83     Pulse Rate 05/07/17 1322 91     Resp 05/07/17 1322 16     Temp 05/07/17 1322 98.9 F (37.2 C)     Temp Source 05/07/17 1322 Oral     SpO2 05/07/17 1322 96 %     Weight 05/07/17 1322 260 lb (117.9 kg)     Height 05/07/17 1322 5\' 10"  (1.778 m)     Head Circumference --      Peak Flow --      Pain Score 05/07/17 1321 7     Pain Loc --      Pain Edu? --      Excl. in GC? --     Constitutional: Alert and oriented. Well appearing  and in no acute distress. Eyes: Conjunctivae are clear without discharge or drainage Head: Atraumatic Neck: Supple. Respiratory: Respirations even and unlabored Musculoskeletal: Diffuse tenderness over the left wrist.  Compartments soft.  Full, active range of motion of the fingers of the left no tenderness elicited on palpation over the left elbow or shoulder. Neurologic: Radius, ulnar, and median nerves and intact. Skin: Skin colored, maculopapular lesion noted in the joint space on the volar aspect of the left wrist that is mobile. Psychiatric: Affect and behavior are normal.  ____________________________________________   LABS (all labs ordered are listed, but only abnormal results are displayed)  Labs Reviewed - No data to  display ____________________________________________  RADIOLOGY  Left wrist image negative for acute bony abnormality per radiology. ____________________________________________   PROCEDURES  .Splint Application Date/Time: 05/07/2017 3:24 PM Performed by: Chinita Pesterriplett, Kymoni Lesperance B, FNP Authorized by: Chinita Pesterriplett, Genevia Bouldin B, FNP   Consent:    Consent obtained:  Verbal   Consent given by:  Patient Pre-procedure details:    Sensation:  Normal Procedure details:    Laterality:  Left   Location:  Wrist   Wrist:  L wrist   Supplies:  Prefabricated splint Post-procedure details:    Pain:  Unchanged   Sensation:  Normal   Patient tolerance of procedure:  Tolerated well, no immediate complications    ____________________________________________   INITIAL IMPRESSION / ASSESSMENT AND PLAN / ED COURSE  Hector Robbins is a 51 y.o. male for evaluation and treatment of left who presents today wrist pain.  Symptoms, exam, and x-ray are consistent with left wrist sprain and a ganglion cyst.  He was given referral to hand specialist.  Velcro wrist splint was applied with instruction to remove for showering and while resting, but to wear it when working and at night while in bed.  He was encouraged to take tramadol as prescribed.  He was encouraged to also take the ibuprofen as he has already been doing.  He was instructed to see his primary care provider or return to the emergency department for symptoms of change or worsen if he is unable to see the hand specialist.  Medications - No data to display  Pertinent labs & imaging results that were available during my care of the patient were reviewed by me and considered in my medical decision making (see chart for details).  _________________________________________   FINAL CLINICAL IMPRESSION(S) / ED DIAGNOSES  Final diagnoses:  Wrist sprain, left, initial encounter  Ganglion cyst of volar aspect of left wrist    ED Discharge Orders         Ordered    traMADol (ULTRAM) 50 MG tablet  Every 6 hours PRN     05/07/17 1501       If controlled substance prescribed during this visit, 12 month history viewed on the [facility] prior to issuing an initial prescription for Schedule II or III opiod.    Chinita Pesterriplett, Timoth Schara B, FNP 05/07/17 1527    Sharyn CreamerQuale, Mark, MD 05/07/17 1925

## 2018-03-27 IMAGING — CR DG CHEST 2V
2 series · 2 of 2 positions shown · non-contrast
Comparison: 04/20/2008.

CLINICAL DATA: Productive cough with congestion and dizziness for 1
week. Social smoker.

EXAM:
CHEST  2 VIEW

[chest pa]
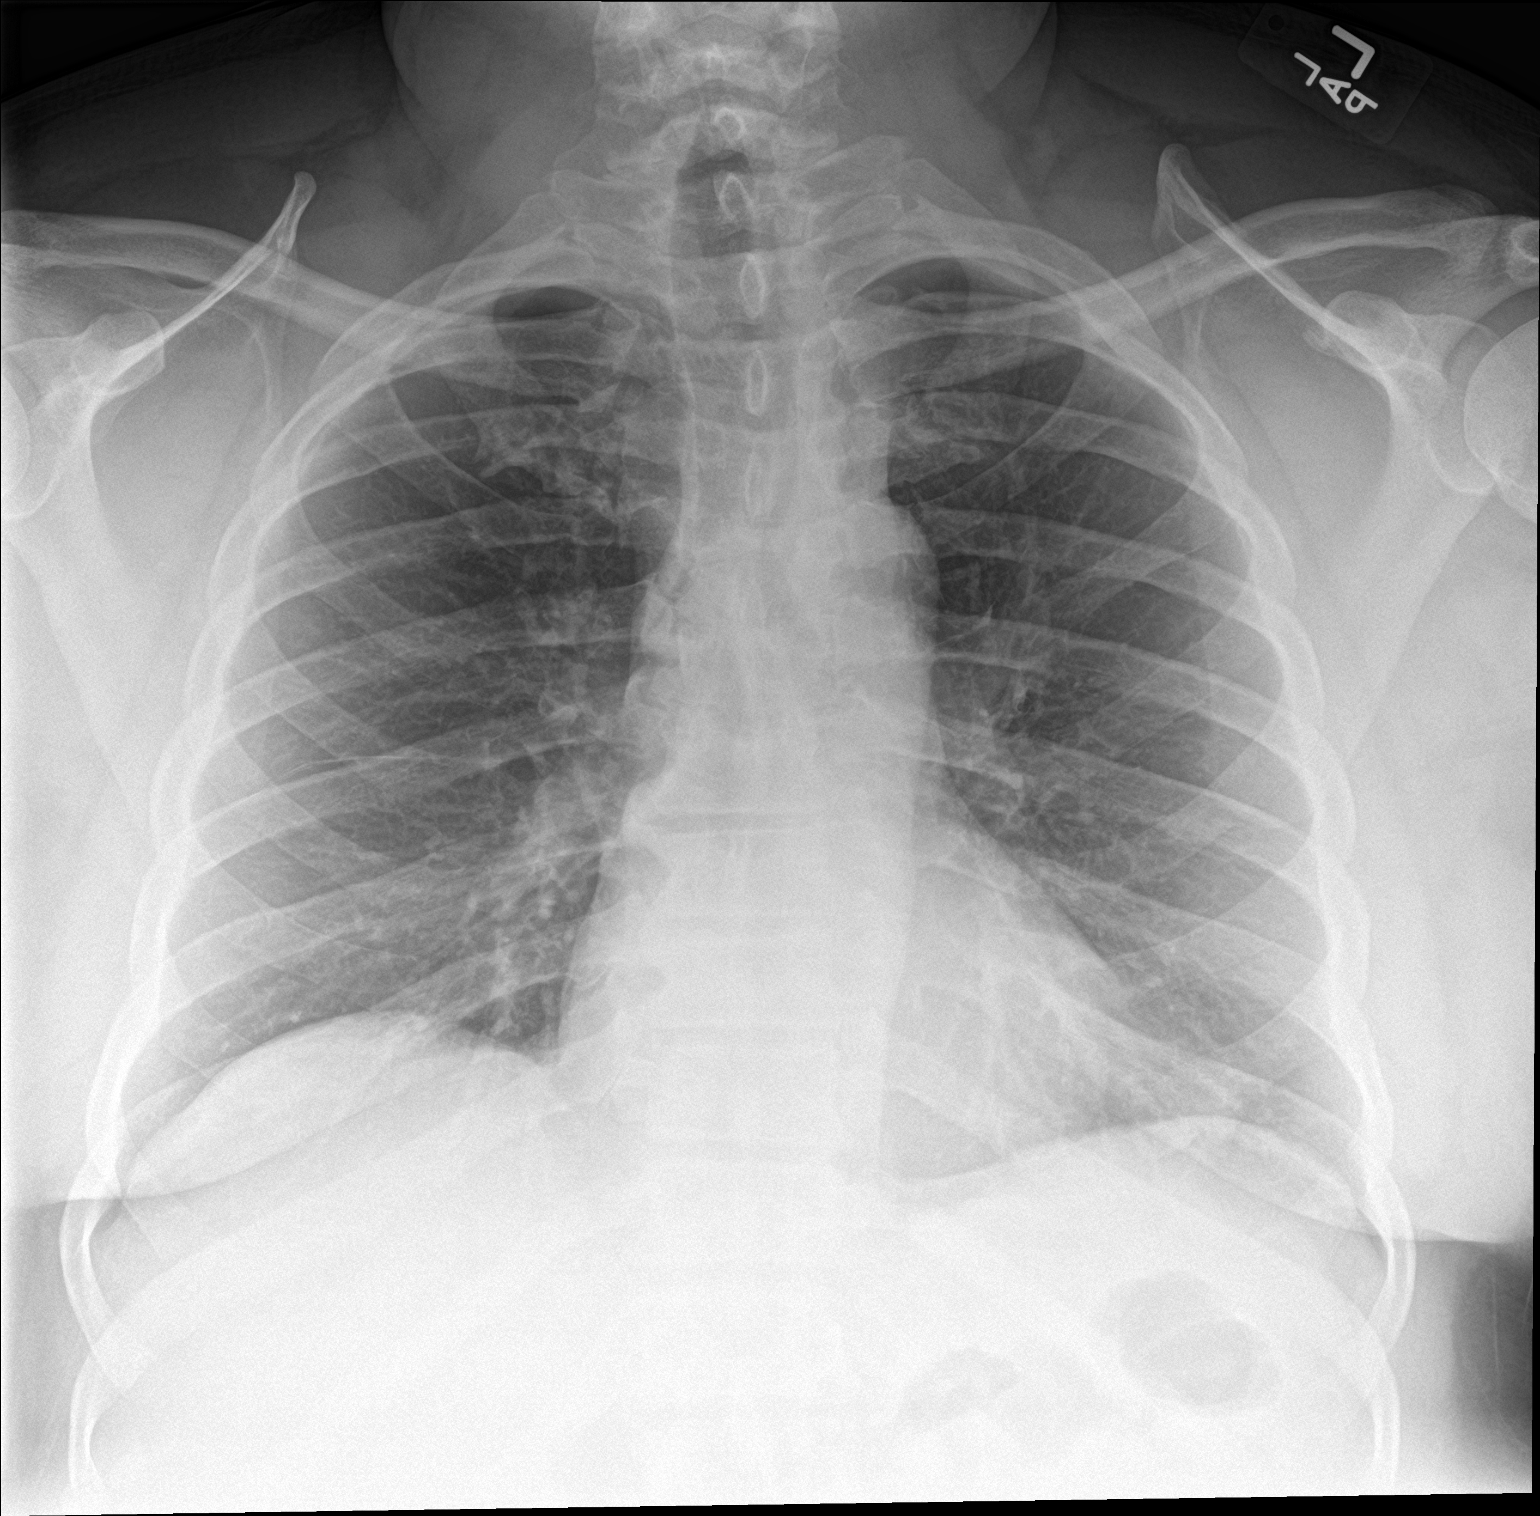

[chest lat]
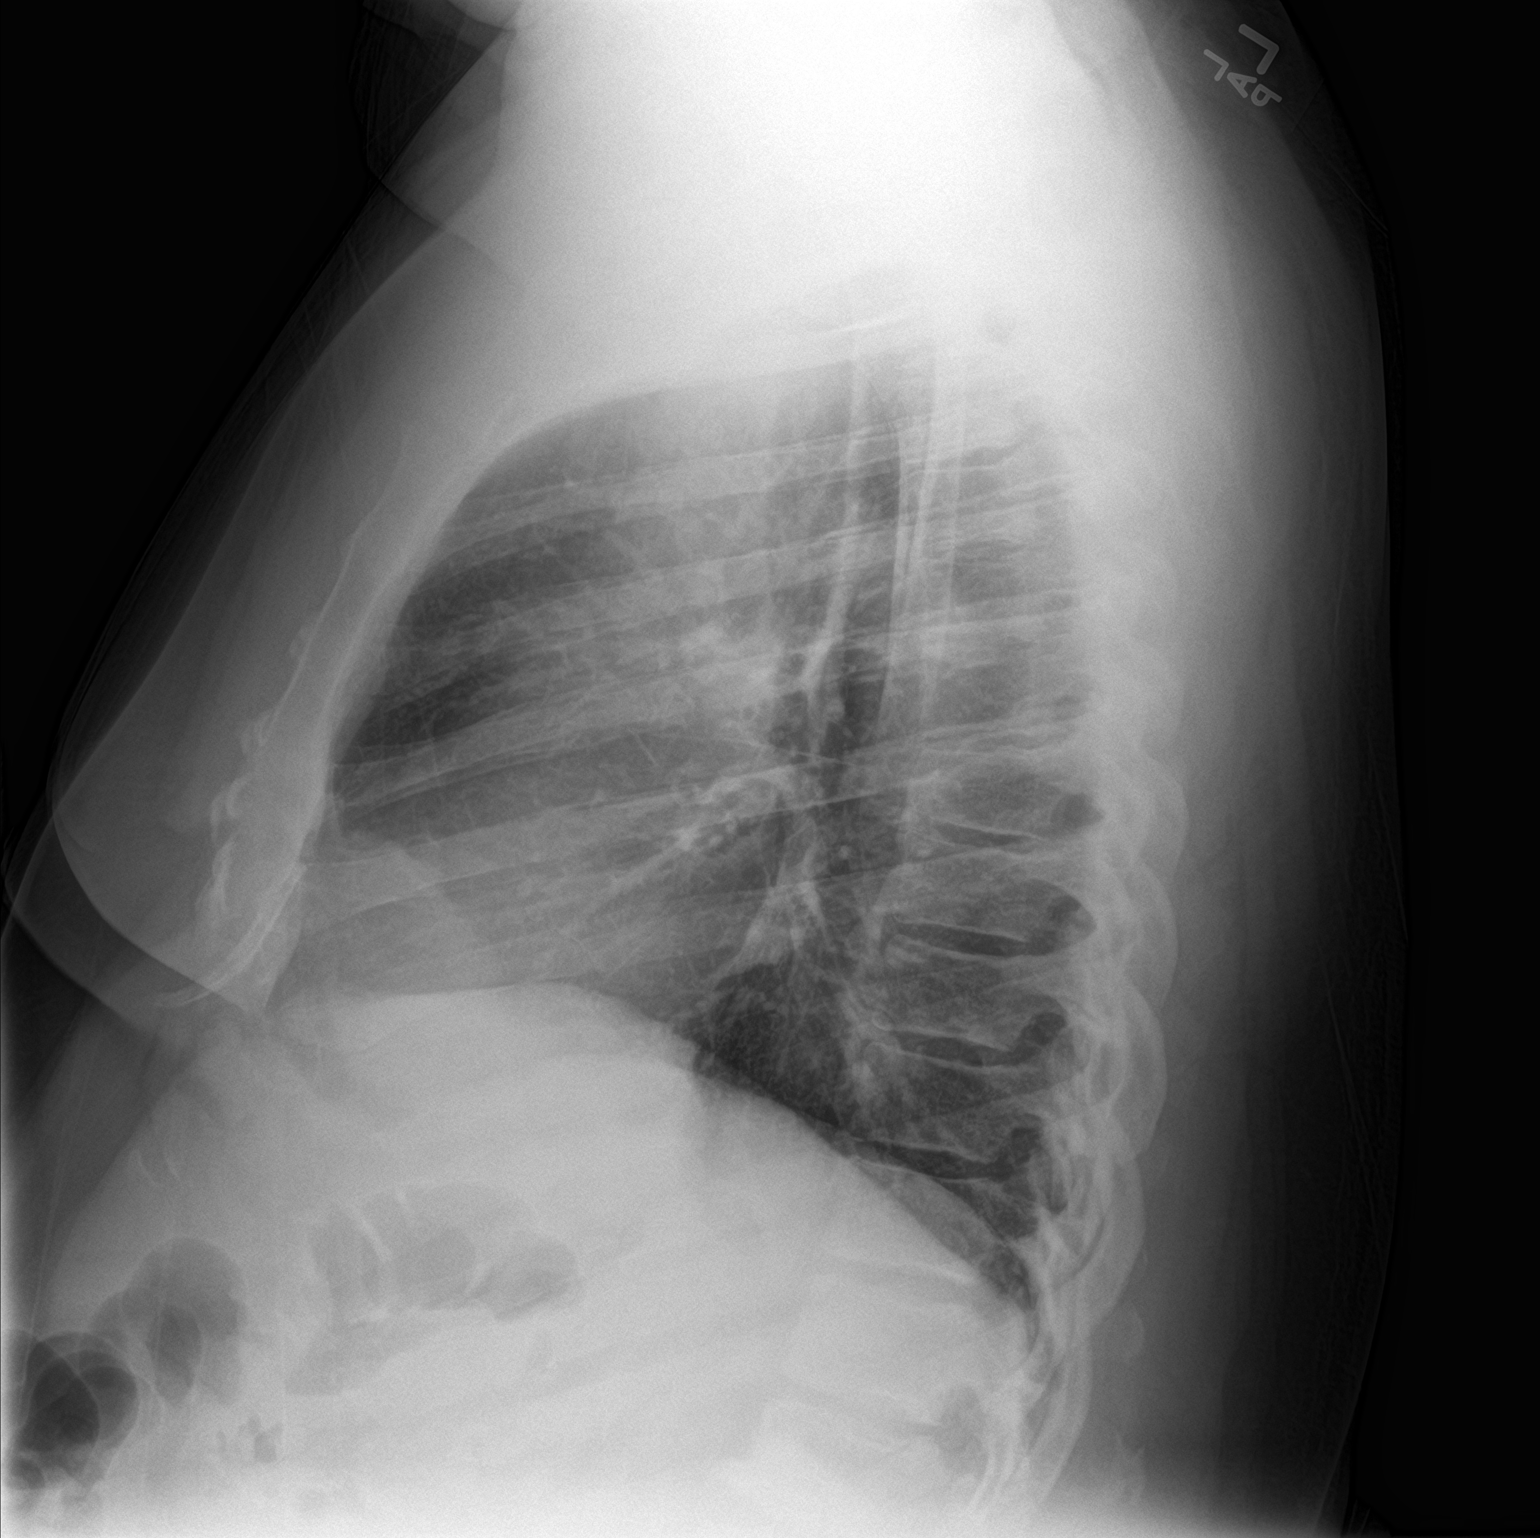

[2 of 2 positions shown; findings below may reference images not displayed]

FINDINGS: The heart size and mediastinal contours are normal. The lungs are
clear. There is no pleural effusion or pneumothorax. No acute
osseous findings are identified. Stable degenerative changes within
the thoracic spine.
IMPRESSION: Stable chest.  No active cardiopulmonary process.

## 2018-06-22 IMAGING — US US SCROTUM
1 series · 13 of 25 positions shown · non-contrast
Comparison: CT Abdomen and Pelvis 09/10/2014

CLINICAL DATA: 50-year-old male with acute left testicular pain
starting this morning. Burning with urination for 2-3 days. Initial
encounter.

EXAM:
SCROTAL ULTRASOUND
DOPPLER ULTRASOUND OF THE TESTICLES
TECHNIQUE: Complete ultrasound examination of the testicles, epididymis, and
other scrotal structures was performed. Color and spectral Doppler
ultrasound were also utilized to evaluate blood flow to the
testicles.

[Series 1: us scrotum · 0.08mm/px · 13 of 83 slices shown]
[im 1/83]
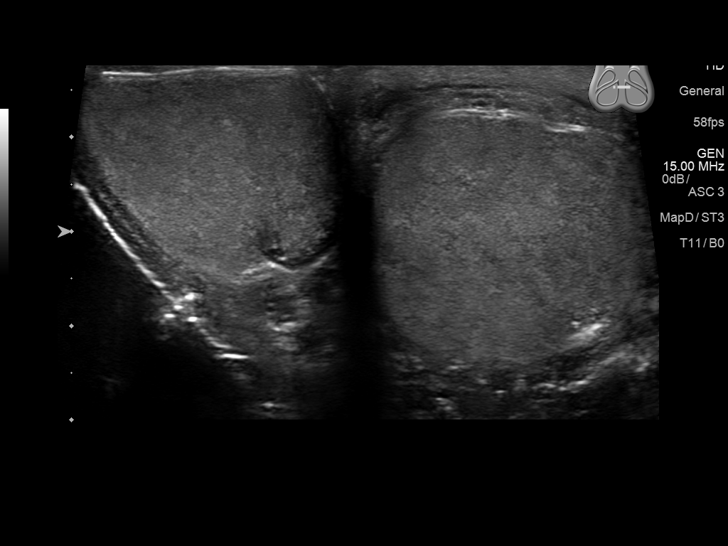
[im 7/83]
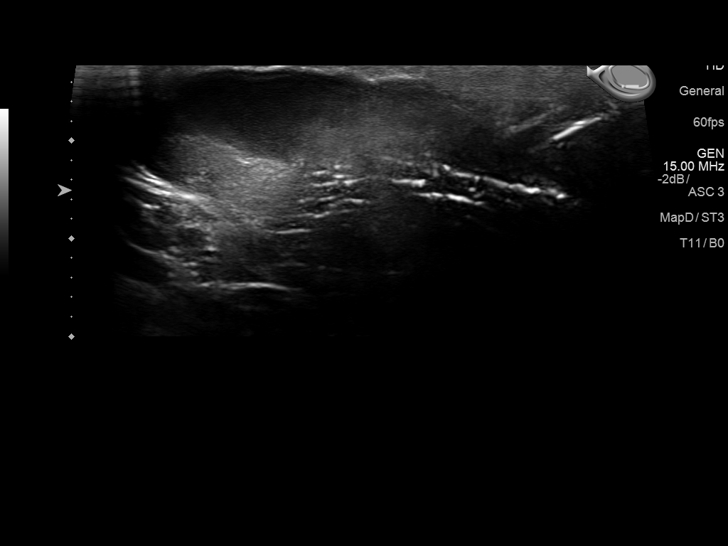
[im 14/83]
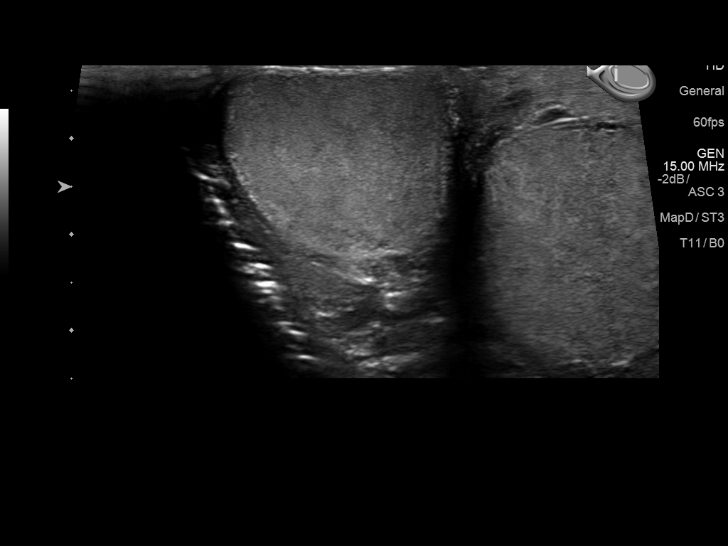
[im 21/83]
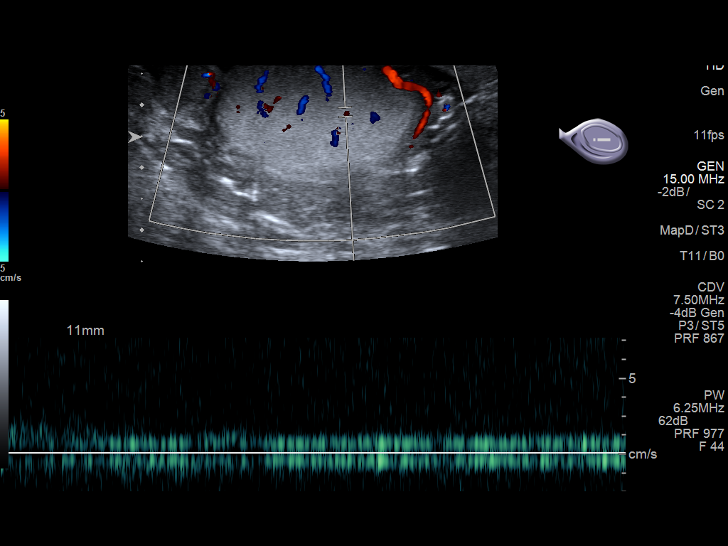
[im 28/83]
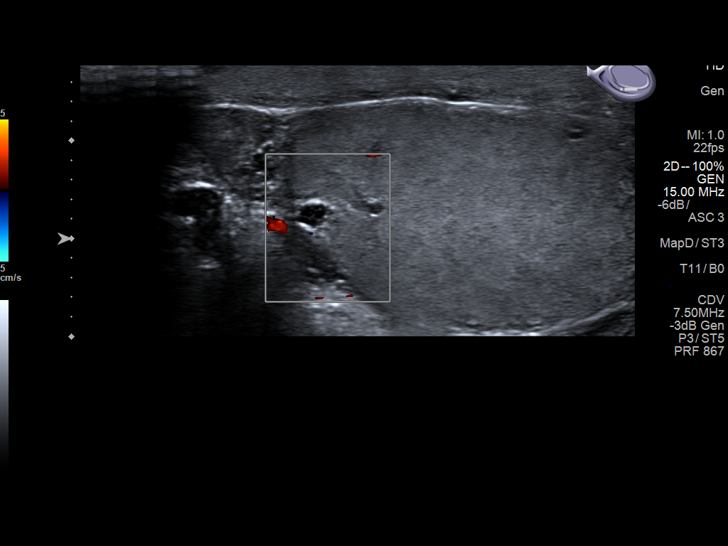
[im 35/83]
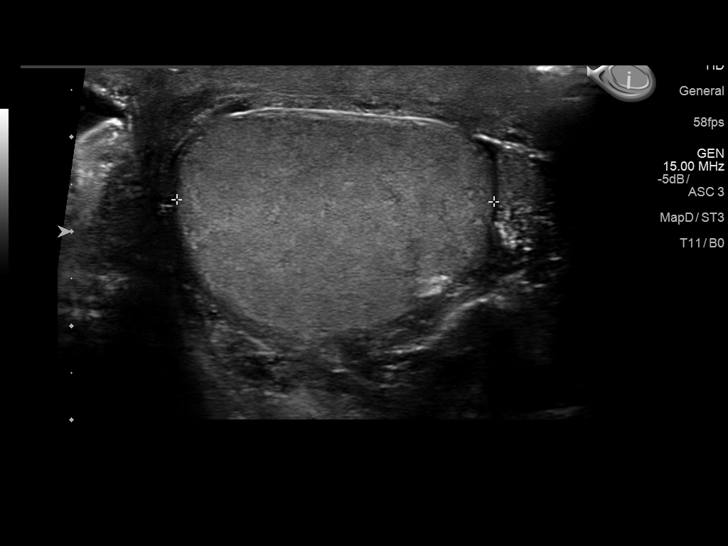
[im 42/83]
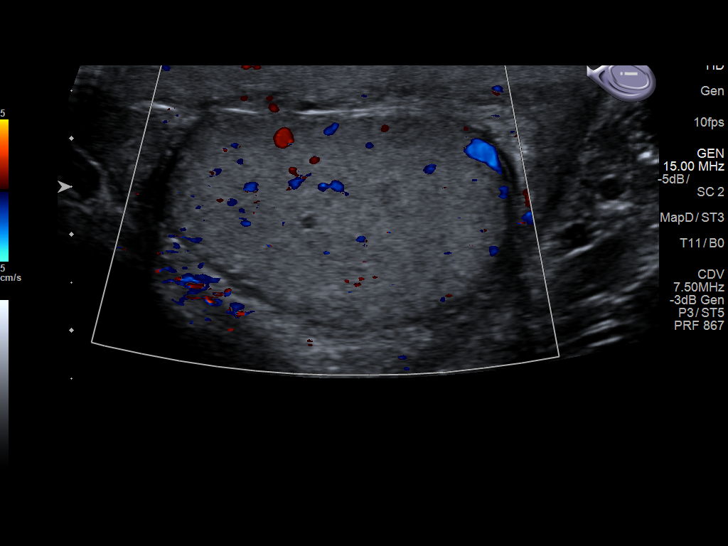
[im 48/83]
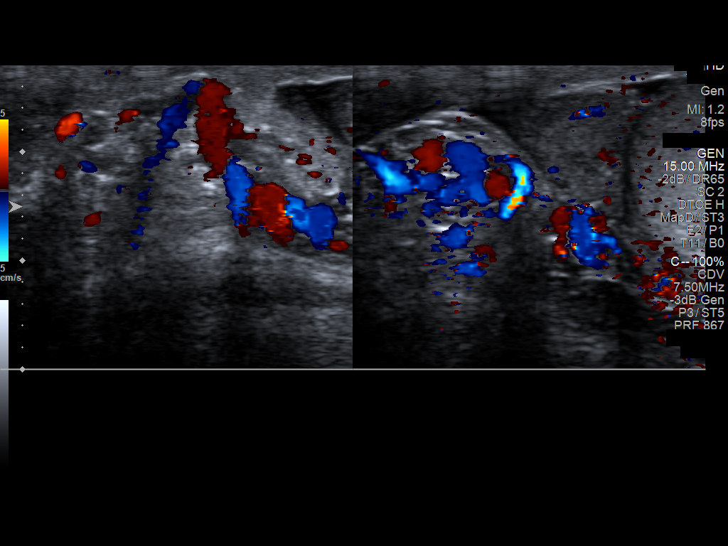
[im 55/83]
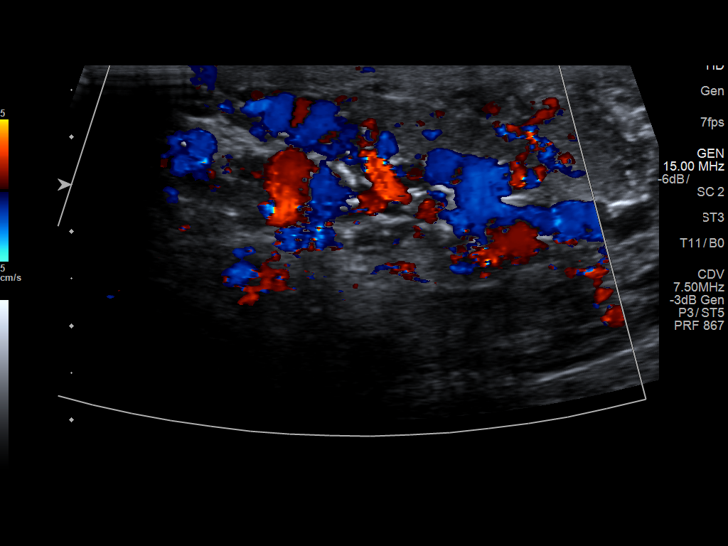
[im 62/83]
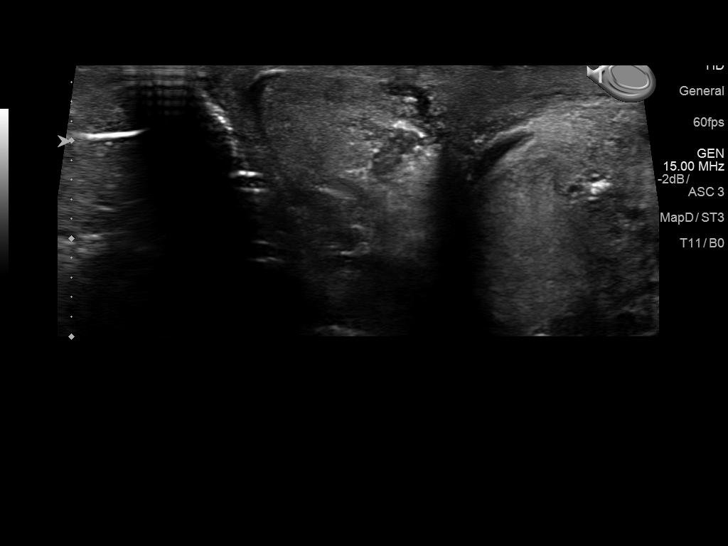
[im 69/83]
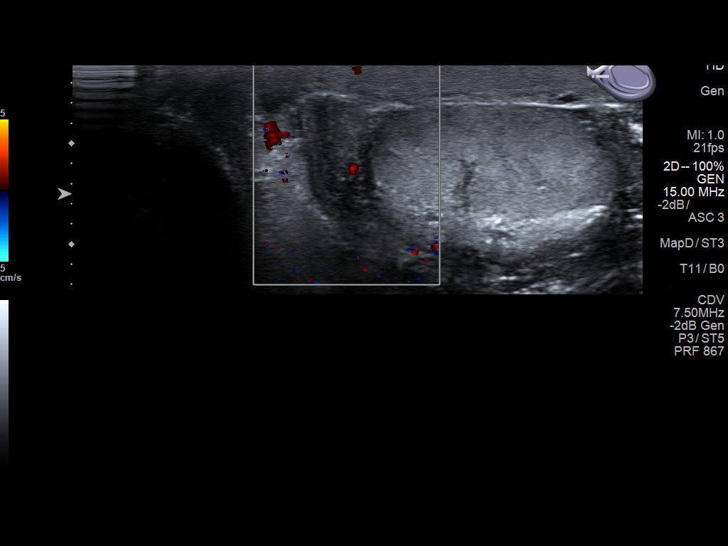
[im 76/83]
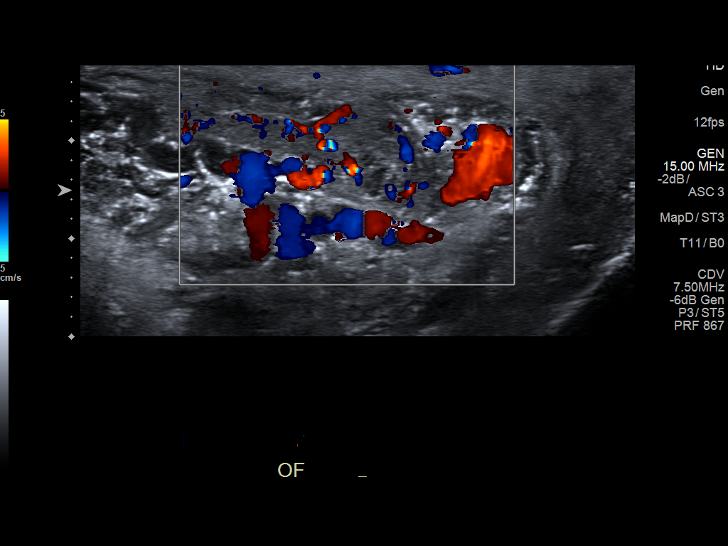
[im 83/83]
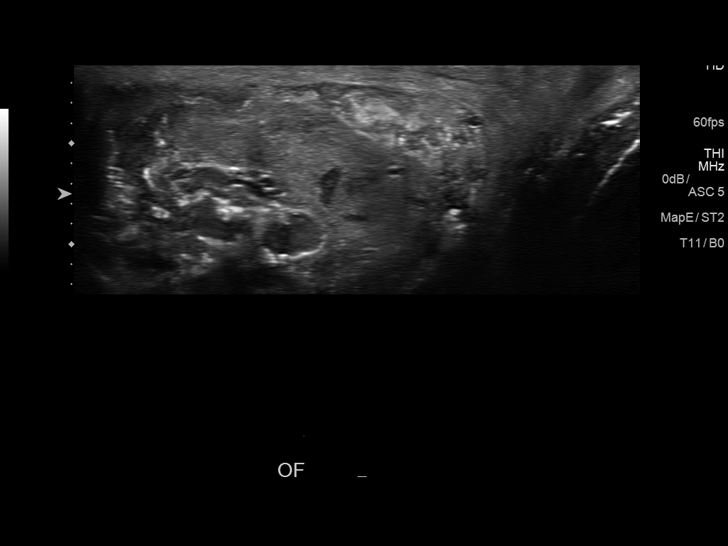

[13 of 25 positions shown; findings below may reference images not displayed]

FINDINGS: Right testicle

Measurements: 4.3 x 2.5 x 2.7 cm. No mass or microlithiasis
visualized.

Left testicle

Measurements: 3.9 x 2.3 x 3.4 cm. 3 mm hypoechoic area at the upper
pole of the testis has no associated vascularity (image 29). There
are 1 or 2 other nearby a tiny cysts, perhaps this represents mild
or early tubular ectasia of the William Alonso testis. Echogenicity is
otherwise within normal limits. No mass or microlithiasis
visualized.

Right epididymis:  Normal in size and appearance.

Left epididymis: The left epididymal head appears within normal
limits however the body and tail of the epididymis appear somewhat
enlarged and heterogeneous with some hypervascularity and correspond
to the clinical area of pain (images 76 through 83).

Hydrocele:  None visualized.

Varicocele: Positive left varicocele, including around the tail of
the epididymis described above. Borderline to mild right side
varicocele.

Pulsed Doppler interrogation of both testes demonstrates normal low
resistance arterial and venous waveforms bilaterally.
IMPRESSION: 1. No evidence of testicular torsion. Negative testes; a 3 mm
hypoechoic cyst at the upper pole of the left testicle appears
inconsequential.
2. Heterogeneous tail of the left epididymis seems to correspond to
the clinical area of pain, with adjacent left varicocele. Although
the tail of the epididymis is mildly hypervascular, the epididymal
head appears normal arguing against acute epididymitis.

## 2018-08-01 ENCOUNTER — Other Ambulatory Visit: Payer: Self-pay

## 2018-08-01 ENCOUNTER — Emergency Department
Admission: EM | Admit: 2018-08-01 | Discharge: 2018-08-01 | Disposition: A | Discharge status: Home / Self Care | Payer: Self-pay | Attending: Emergency Medicine | Admitting: Emergency Medicine

## 2018-08-01 DIAGNOSIS — J069 Acute upper respiratory infection, unspecified: Secondary | ICD-10-CM | POA: Insufficient documentation

## 2018-08-01 DIAGNOSIS — J45909 Unspecified asthma, uncomplicated: Secondary | ICD-10-CM | POA: Insufficient documentation

## 2018-08-01 DIAGNOSIS — H5789 Other specified disorders of eye and adnexa: Secondary | ICD-10-CM | POA: Insufficient documentation

## 2018-08-01 DIAGNOSIS — J3489 Other specified disorders of nose and nasal sinuses: Secondary | ICD-10-CM | POA: Insufficient documentation

## 2018-08-01 DIAGNOSIS — R067 Sneezing: Secondary | ICD-10-CM | POA: Insufficient documentation

## 2018-08-01 MED ORDER — PSEUDOEPH-BROMPHEN-DM 30-2-10 MG/5ML PO SYRP: 5.0000 mL | ORAL_SOLUTION | Freq: Four times a day (QID) | ORAL | 0 refills | Status: DC | PRN

## 2018-08-01 MED ORDER — ALBUTEROL SULFATE HFA 108 (90 BASE) MCG/ACT IN AERS: 2.0000 | INHALATION_SPRAY | Freq: Four times a day (QID) | RESPIRATORY_TRACT | 0 refills | Status: AC | PRN

## 2018-08-01 NOTE — ED Triage Notes (Addendum)
Cough, runny nose and watery eyes X 1 week. Pt alert and oriented X4, active, cooperative, pt in NAD. RR even and unlabored, color WNL.    Denies CP or SOB.

## 2018-08-01 NOTE — ED Notes (Signed)
See triage note  Presents with  11/2 weeks of cough,and runny nose  Denies any fever  States cough has been non productive

## 2018-08-01 NOTE — Discharge Instructions (Signed)
Follow-up with your primary care provider if any continued problems.  Take Bromfed-DM as needed for cough, congestion, and sneezing.  Increase fluids.  Discontinue taking DayQuil and NyQuil at this time.

## 2018-08-01 NOTE — ED Provider Notes (Signed)
[facility] Emergency Department Provider Note  ____________________________________________   None    (approximate)  I have reviewed the triage vital signs and the nursing notes.   HISTORY  Chief Complaint Cough and Nasal Congestion   HPI Hector Robbins is a 53 y.o. male presents to the ED with complaint of cough, rhinorrhea, sneezing and watery eyes for 1 week.  Patient states that he has been taking DayQuil and NyQuil without any relief.   Patient denies any fever.  He states cough is worse at night.  He denies any nausea, vomiting, diarrhea or body aches.   Past Medical History:  Diagnosis Date  . Asthma     Patient Active Problem List   Diagnosis Date Noted  . Asthma 11/21/2016  . Morbid obesity with BMI of 40.0-44.9, adult (HCC) 11/21/2016  . Suspected sleep apnea 11/21/2016  . Pre-hypertension 11/21/2016  . Screening for colon cancer 11/21/2016  . Screening for prostate cancer 11/21/2016  . External hemorrhoids 11/21/2016    History reviewed. No pertinent surgical history.  Prior to Admission medications   Medication Sig Start Date End Date Taking? Authorizing Provider  albuterol (PROVENTIL HFA;VENTOLIN HFA) 108 (90 Base) MCG/ACT inhaler Inhale 2 puffs into the lungs every 6 (six) hours as needed for wheezing or shortness of breath. 06/03/16   Orvil Feil, PA-C  brompheniramine-pseudoephedrine-DM 30-2-10 MG/5ML syrup Take 5 mLs by mouth 4 (four) times daily as needed. 08/01/18   Tommi Rumps, PA-C    Allergies Patient has no known allergies.  Family History  Problem Relation Age of Onset  . Diabetes Mother   . Hypertension Mother   . Dementia Father   . Hypertension Sister   . Obstructive Sleep Apnea Sister   . Cancer Brother 71       Nasal Cancer  . Lymphoma Brother 59       Lymphoma  . Hypertension Brother   . Prostate cancer Neg Hx   . Colon cancer Neg Hx     Social History Social History   Tobacco Use  .  Smoking status: Never Smoker  . Smokeless tobacco: Never Used  Substance Use Topics  . Alcohol use: Yes    Alcohol/week: 1.0 standard drinks    Types: 1 Cans of beer per week    Comment: 1-2 drinks not every weekend  . Drug use: No    Review of Systems Constitutional: No fever/chills Eyes: No visual changes.  Positive watery eyes. ENT: No sore throat.  Positive rhinorrhea, sneezing. Cardiovascular: Denies chest pain. Respiratory: Denies shortness of breath.  Positive cough. Gastrointestinal: No abdominal pain.  No nausea, no vomiting.  No diarrhea.   Musculoskeletal: Negative for back pain. Skin: Negative for rash. Neurological: Negative for headaches, focal weakness or numbness. ___________________________________________   PHYSICAL EXAM:  VITAL SIGNS: ED Triage Vitals [08/01/18 0710]  Enc Vitals Group     BP (!) 145/94     Pulse Rate 94     Resp 16     Temp 98.4 F (36.9 C)     Temp Source Oral     SpO2 100 %     Weight 260 lb (117.9 kg)     Height 5\' 9"  (1.753 m)     Head Circumference      Peak Flow      Pain Score 0     Pain Loc      Pain Edu?      Excl. in GC?  Constitutional: Alert and oriented. Well appearing and in no acute distress. Eyes: Conjunctivae are normal.  Head: Atraumatic. Nose: Positive congestion/clear rhinnorhea.  TMs are dull bilaterally. Mouth/Throat: Mucous membranes are moist.  Oropharynx non-erythematous.  Posterior drainage present. Neck: No stridor.   Hematological/Lymphatic/Immunilogical: No cervical lymphadenopathy. Cardiovascular: Normal rate, regular rhythm. Grossly normal heart sounds.  Good peripheral circulation. Respiratory: Normal respiratory effort.  No retractions. Lungs CTAB. Gastrointestinal: Soft and nontender. No distention. Musculoskeletal: Moves upper and lower extremities with any difficulty normal gait was noted. Neurologic:  Normal speech and language. No gross focal neurologic deficits are appreciated.    Skin:  Skin is warm, dry and intact. No rash noted. Psychiatric: Mood and affect are normal. Speech and behavior are normal.  ____________________________________________   LABS (all labs ordered are listed, but only abnormal results are displayed)  Labs Reviewed - No data to display  PROCEDURES  Procedure(s) performed (including Critical Care):  Procedures   ____________________________________________   INITIAL IMPRESSION / ASSESSMENT AND PLAN / ED COURSE  As part of my medical decision making, I reviewed the following data within the electronic MEDICAL RECORD NUMBER Notes from prior ED visits and [place] Controlled Substance Database  Patient presents to the ED with complaint of 1 week URI symptoms without relief from over-the-counter medication.  He denies any fever and physical exam is positive for clear rhinorrhea and congestion.  Patient was given a prescription for Bromfed-DM as needed for cough and congestion.  He is to follow-up with his PCP if any continued problems.  He was given a note to remain out of work today.  ____________________________________________   FINAL CLINICAL IMPRESSION(S) / ED DIAGNOSES  Final diagnoses:  Acute upper respiratory infection     ED Discharge Orders         Ordered    brompheniramine-pseudoephedrine-DM 30-2-10 MG/5ML syrup  4 times daily PRN     08/01/18 0731           Note:  This document was prepared using Dragon voice recognition software and may include unintentional dictation errors.    Tommi Rumps, PA-C 08/01/18 [id number]    Arnaldo Natal, MD 08/01/18 [phone]

## 2018-08-14 ENCOUNTER — Emergency Department
Admission: EM | Admit: 2018-08-14 | Discharge: 2018-08-14 | Disposition: A | Discharge status: Home / Self Care | Payer: Self-pay | Attending: Emergency Medicine | Admitting: Emergency Medicine

## 2018-08-14 ENCOUNTER — Other Ambulatory Visit: Payer: Self-pay

## 2018-08-14 DIAGNOSIS — J45909 Unspecified asthma, uncomplicated: Secondary | ICD-10-CM | POA: Insufficient documentation

## 2018-08-14 DIAGNOSIS — R040 Epistaxis: Secondary | ICD-10-CM | POA: Insufficient documentation

## 2018-08-14 MED ORDER — PHENYLEPHRINE HCL 0.5 % NA SOLN
1.0000 [drp] | Freq: Once | NASAL | Status: AC
  Administered 2018-08-14: 1 [drp] via NASAL
  Filled 2018-08-14: qty 15

## 2018-08-14 NOTE — ED Notes (Signed)
Afrin and nose pincher given to patient by this RN

## 2018-08-14 NOTE — ED Notes (Signed)
Pt c/o of HA 7/10. VSS. NAD noted at this time.

## 2018-08-14 NOTE — ED Provider Notes (Deleted)
     Emily Filbert, MD 08/14/18 [phone]

## 2018-08-14 NOTE — ED Triage Notes (Signed)
Pt in with co nosebleed to right nare tonight, no hx of the same. States has recently been treated for URI. Pt has packed cotton to both nostrils, no bleeding noted at this time.

## 2018-08-14 NOTE — ED Provider Notes (Signed)
[facility] Emergency Department Provider Note  ____________________________________________  Time seen: Approximately 7:30 AM  I have reviewed the triage vital signs and the nursing notes.   HISTORY  Chief Complaint Epistaxis   HPI Hector Robbins is a 53 y.o. male who presents for evaluation of epistaxis.  Patient reports that he his nosebleed started 1 AM.  He was able to control however restarted again at 4 AM.  Nosebleed from the right side.  No prior history of such.  Patient is not on blood thinners.  No history of hypertension.  At this time bleeding is controlled.  Patient denies any trauma.  Past Medical History:  Diagnosis Date  . Asthma     Patient Active Problem List   Diagnosis Date Noted  . Asthma 11/21/2016  . Morbid obesity with BMI of 40.0-44.9, adult (HCC) 11/21/2016  . Suspected sleep apnea 11/21/2016  . Pre-hypertension 11/21/2016  . Screening for colon cancer 11/21/2016  . Screening for prostate cancer 11/21/2016  . External hemorrhoids 11/21/2016    No past surgical history on file.  Prior to Admission medications   Medication Sig Start Date End Date Taking? Authorizing Provider  albuterol (PROVENTIL HFA;VENTOLIN HFA) 108 (90 Base) MCG/ACT inhaler Inhale 2 puffs into the lungs every 6 (six) hours as needed for wheezing or shortness of breath. 08/01/18   Bridget Hartshorn L, PA-C  brompheniramine-pseudoephedrine-DM 30-2-10 MG/5ML syrup Take 5 mLs by mouth 4 (four) times daily as needed. 08/01/18   Tommi Rumps, PA-C    Allergies Patient has no known allergies.  Family History  Problem Relation Age of Onset  . Diabetes Mother   . Hypertension Mother   . Dementia Father   . Hypertension Sister   . Obstructive Sleep Apnea Sister   . Cancer Brother 30       Nasal Cancer  . Lymphoma Brother 59       Lymphoma  . Hypertension Brother   . Prostate cancer Neg Hx   . Colon cancer Neg Hx     Social History Social  History   Tobacco Use  . Smoking status: Never Smoker  . Smokeless tobacco: Never Used  Substance Use Topics  . Alcohol use: Yes    Alcohol/week: 1.0 standard drinks    Types: 1 Cans of beer per week    Comment: 1-2 drinks not every weekend  . Drug use: No    Review of Systems  Constitutional: Negative for fever. Eyes: Negative for visual changes. ENT: Negative for sore throat. + epistaxis Neck: No neck pain  Cardiovascular: Negative for chest pain. Respiratory: Negative for shortness of breath. Musculoskeletal: Negative for back pain. Skin: Negative for rash. Neurological: Negative for headaches, weakness or numbness. Psych: No SI or HI  ____________________________________________   PHYSICAL EXAM:  VITAL SIGNS: ED Triage Vitals [08/14/18 0438]  Enc Vitals Group     BP (!) 150/59     Pulse Rate 80     Resp 20     Temp 98.4 F (36.9 C)     Temp Source Oral     SpO2 97 %     Weight 260 lb (117.9 kg)     Height 5\' 9"  (1.753 m)     Head Circumference      Peak Flow      Pain Score 0     Pain Loc      Pain Edu?      Excl. in GC?     Constitutional:  Alert and oriented. Well appearing and in no apparent distress. HEENT:      Head: Normocephalic and atraumatic.         Eyes: Conjunctivae are normal. Sclera is non-icteric.       Mouth/Throat: Mucous membranes are moist.       Nose: Right-sided mucosa is erythematous with no laceration or abrasion, no active bleeding      Neck: Supple with no signs of meningismus. Cardiovascular: Regular rate and rhythm. No murmurs, gallops, or rubs. 2+ symmetrical distal pulses are present in all extremities. No JVD. Respiratory: Normal respiratory effort. Lungs are clear to auscultation bilaterally. No wheezes, crackles, or rhonchi.  Neurologic: Normal speech and language. Face is symmetric. Moving all extremities. No gross focal neurologic deficits are appreciated. Skin: Skin is warm, dry and intact. No rash noted. Psychiatric:  Mood and affect are normal. Speech and behavior are normal.  ____________________________________________   LABS (all labs ordered are listed, but only abnormal results are displayed)  Labs Reviewed - No data to display ____________________________________________  EKG  none  ____________________________________________  RADIOLOGY  none  ____________________________________________   PROCEDURES  Procedure(s) performed: None Procedures Critical Care performed:  None ____________________________________________   INITIAL IMPRESSION / ASSESSMENT AND PLAN / ED COURSE  53 y.o. male who presents for evaluation of epistaxis.  Bleeding controlled prior to my evaluation.  On exam patient has mildly erythematous mucosal membrane on the right with no abrasion or laceration.  Patient was given a nose pincher and Afrin in case of bleeding recurs.  Discussed in a return precaution and follow-up with primary care doctor.      As part of my medical decision making, I reviewed the following data within the electronic MEDICAL RECORD NUMBER Nursing notes reviewed and incorporated, Old chart reviewed, Notes from prior ED visits and [place] Controlled Substance Database    Pertinent labs & imaging results that were available during my care of the patient were reviewed by me and considered in my medical decision making (see chart for details).    ____________________________________________   FINAL CLINICAL IMPRESSION(S) / ED DIAGNOSES  Final diagnoses:  Epistaxis      NEW MEDICATIONS STARTED DURING THIS VISIT:  ED Discharge Orders    None       Note:  This document was prepared using Dragon voice recognition software and may include unintentional dictation errors.    Don Perking, [place], MD 08/14/18 [phone]

## 2018-08-15 ENCOUNTER — Emergency Department
Admission: EM | Admit: 2018-08-15 | Discharge: 2018-08-15 | Disposition: A | Discharge status: Home / Self Care | Payer: Self-pay | Attending: Emergency Medicine | Admitting: Emergency Medicine

## 2018-08-15 ENCOUNTER — Encounter: Payer: Self-pay | Admitting: Emergency Medicine

## 2018-08-15 ENCOUNTER — Other Ambulatory Visit: Payer: Self-pay

## 2018-08-15 DIAGNOSIS — R040 Epistaxis: Secondary | ICD-10-CM | POA: Insufficient documentation

## 2018-08-15 DIAGNOSIS — J45909 Unspecified asthma, uncomplicated: Secondary | ICD-10-CM | POA: Insufficient documentation

## 2018-08-15 MED ORDER — SILVER NITRATE-POT NITRATE 75-25 % EX MISC
1.0000 | Freq: Once | CUTANEOUS | Status: AC
  Administered 2018-08-15: 1 via TOPICAL
  Filled 2018-08-15 (×2): qty 1

## 2018-08-15 NOTE — ED Provider Notes (Signed)
[facility] Emergency Department Provider Note   ____________________________________________    I have reviewed the triage vital signs and the nursing notes.   HISTORY  Chief Complaint Epistaxis     HPI Hector Robbins is a 53 y.o. male who presents with complaints of a nosebleed.  Patient describes bleeding from his right nare.  He is able to get it to stop with a cottonball but reports it is recurred multiple times throughout the night.  Seen here yesterday for the same, was discharged with nasal clamp and Afrin.  Not on blood thinners.  No head injury.  Past Medical History:  Diagnosis Date  . Asthma     Patient Active Problem List   Diagnosis Date Noted  . Asthma 11/21/2016  . Morbid obesity with BMI of 40.0-44.9, adult (HCC) 11/21/2016  . Suspected sleep apnea 11/21/2016  . Pre-hypertension 11/21/2016  . Screening for colon cancer 11/21/2016  . Screening for prostate cancer 11/21/2016  . External hemorrhoids 11/21/2016    History reviewed. No pertinent surgical history.  Prior to Admission medications   Medication Sig Start Date End Date Taking? Authorizing Provider  albuterol (PROVENTIL HFA;VENTOLIN HFA) 108 (90 Base) MCG/ACT inhaler Inhale 2 puffs into the lungs every 6 (six) hours as needed for wheezing or shortness of breath. 08/01/18   Bridget Hartshorn L, PA-C  brompheniramine-pseudoephedrine-DM 30-2-10 MG/5ML syrup Take 5 mLs by mouth 4 (four) times daily as needed. 08/01/18   Tommi Rumps, PA-C     Allergies Patient has no known allergies.  Family History  Problem Relation Age of Onset  . Diabetes Mother   . Hypertension Mother   . Dementia Father   . Hypertension Sister   . Obstructive Sleep Apnea Sister   . Cancer Brother 23       Nasal Cancer  . Lymphoma Brother 59       Lymphoma  . Hypertension Brother   . Prostate cancer Neg Hx   . Colon cancer Neg Hx     Social History Social History   Tobacco Use  .  Smoking status: Never Smoker  . Smokeless tobacco: Never Used  Substance Use Topics  . Alcohol use: Yes    Alcohol/week: 1.0 standard drinks    Types: 1 Cans of beer per week    Comment: 1-2 drinks not every weekend  . Drug use: No    Review of Systems  Constitutional: No fever/chills Eyes: No visual changes.  ENT: As above Cardiovascular: Denies chest pain. Respiratory: Denies shortness of breath. Gastrointestinal: No abdominal pain.  Genitourinary: Negative for dysuria. Musculoskeletal: Negative for back pain. Skin: Negative for rash. Neurological: Negative for headaches   ____________________________________________   PHYSICAL EXAM:  VITAL SIGNS: ED Triage Vitals  Enc Vitals Group     BP 08/15/18 0822 139/79     Pulse Rate 08/15/18 0822 73     Resp 08/15/18 0822 16     Temp 08/15/18 0822 97.7 F (36.5 C)     Temp Source 08/15/18 0822 Oral     SpO2 08/15/18 0822 96 %     Weight 08/15/18 0823 117.9 kg (259 lb 14.8 oz)     Height 08/15/18 0823 1.753 m (5\' 9" )     Head Circumference --      Peak Flow --      Pain Score 08/15/18 0822 0     Pain Loc --      Pain Edu? --  Excl. in GC? --     Constitutional: Alert and oriented. No acute distress. Pleasant and interactive Eyes: Conjunctivae are normal.  Head: Atraumatic. Nose: Right naris: Small area of clot along septal wall, anteriorly, no active bleeding Mouth/Throat: Mucous membranes are moist.    Cardiovascular: Normal rate, regular rhythm.  Good peripheral circulation. Respiratory: Normal respiratory effort.  No retractions. Gastrointestinal: Soft and nontender. No distention.    Musculoskeletal:  Warm and well perfused Neurologic:  Normal speech and language. No gross focal neurologic deficits are appreciated.  Skin:  Skin is warm, dry and intact. No rash noted.   ____________________________________________   LABS (all labs ordered are listed, but only abnormal results are displayed)  Labs  Reviewed - No data to display ____________________________________________  EKG  None ____________________________________________  RADIOLOGY  None ____________________________________________   PROCEDURES  Procedure(s) performed: yes  .Epistaxis Management Date/Time: 08/15/2018 9:17 AM Performed by: Jene Every, MD Authorized by: Jene Every, MD   Consent:    Consent obtained:  Verbal   Consent given by:  Patient   Risks discussed:  Nasal injury and pain   Alternatives discussed:  No treatment Anesthesia (see MAR for exact dosages):    Anesthesia method:  None Procedure details:    Treatment site:  R anterior   Treatment method:  Silver nitrate   Treatment complexity:  Limited   Treatment episode: initial   Post-procedure details:    Assessment:  Bleeding stopped   Patient tolerance of procedure:  Tolerated well, no immediate complications     Critical Care performed: No ____________________________________________   INITIAL IMPRESSION / ASSESSMENT AND PLAN / ED COURSE  Pertinent labs & imaging results that were available during my care of the patient were reviewed by me and considered in my medical decision making (see chart for details).  Patient's bleeding controlled in the emergency department.  His wife is actually made him an appointment with ENT within 1 hour so he is ready to go.  Notified by nurse that bleeding started again prior to discharge but the patient like to go to ENT, appropriate for discharge    ____________________________________________   FINAL CLINICAL IMPRESSION(S) / ED DIAGNOSES  Final diagnoses:  Right-sided epistaxis        Note:  This document was prepared using Dragon voice recognition software and may include unintentional dictation errors.   Jene Every, MD 08/15/18 [phone]

## 2018-08-15 NOTE — ED Notes (Signed)
Pt remains in room because does not want to leave until nose bleeding less.

## 2018-08-15 NOTE — ED Notes (Signed)
When pt stood to leave started bleeding from nose. Left nare dripping blood. Pt over sink.  instructed to hold firm pressure and not let up.  Pt reports he cannot do this because feels like cannot breathe.  Dr Cyril Loosen aware and ok with leaving as heading to ENT at 11.

## 2018-08-15 NOTE — ED Triage Notes (Signed)
Pt to ED via POV c/o nosebleed. Pt was seen yesterday for same. Pt wife states that pt is having nosebleeds every 2 hours. Pt has cotton packed in nose at this time with no bleeding noted.

## 2018-08-15 NOTE — ED Notes (Signed)
Pt ambulatory with wife to car. NAD.

## 2018-08-15 NOTE — ED Notes (Signed)
Pharmacy called for silver nitrate stick 

## 2019-02-28 IMAGING — CR DG WRIST COMPLETE 3+V*L*
1 series · 4 of 4 positions shown · non-contrast
Comparison: None.

CLINICAL DATA: Fall 1 week ago. Persistent left wrist pain and
swelling. Initial encounter.

EXAM:
LEFT WRIST - COMPLETE 3+ VIEW

[Series 1: dg wrist complete left · 0.14mm/px · 4 of 4 slices shown]
[im 1/4]
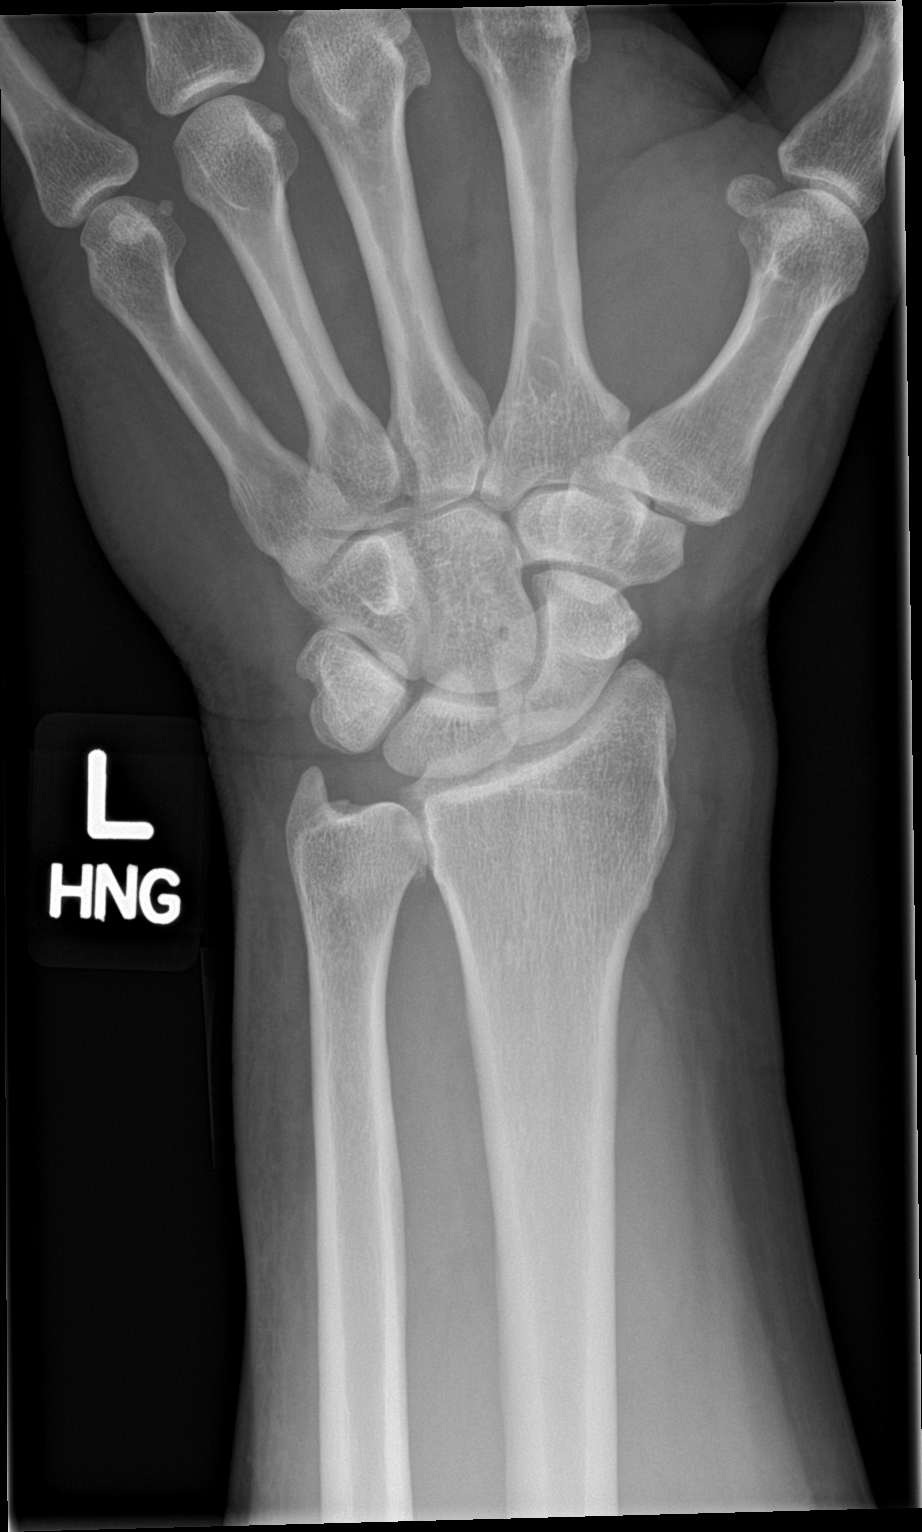
[im 2/4]
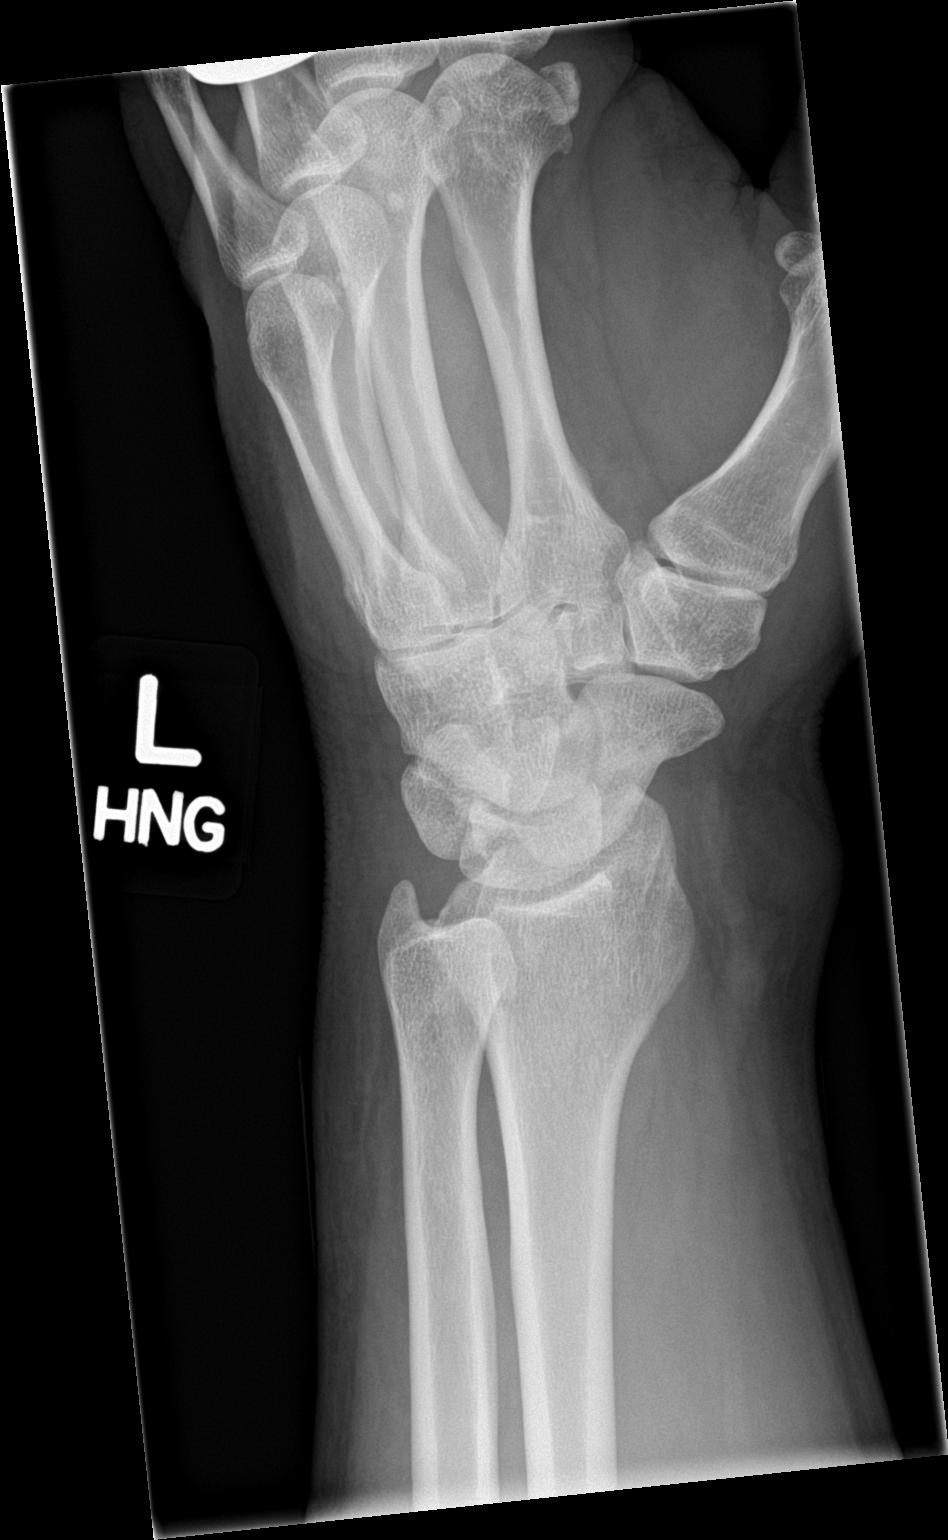
[im 3/4]
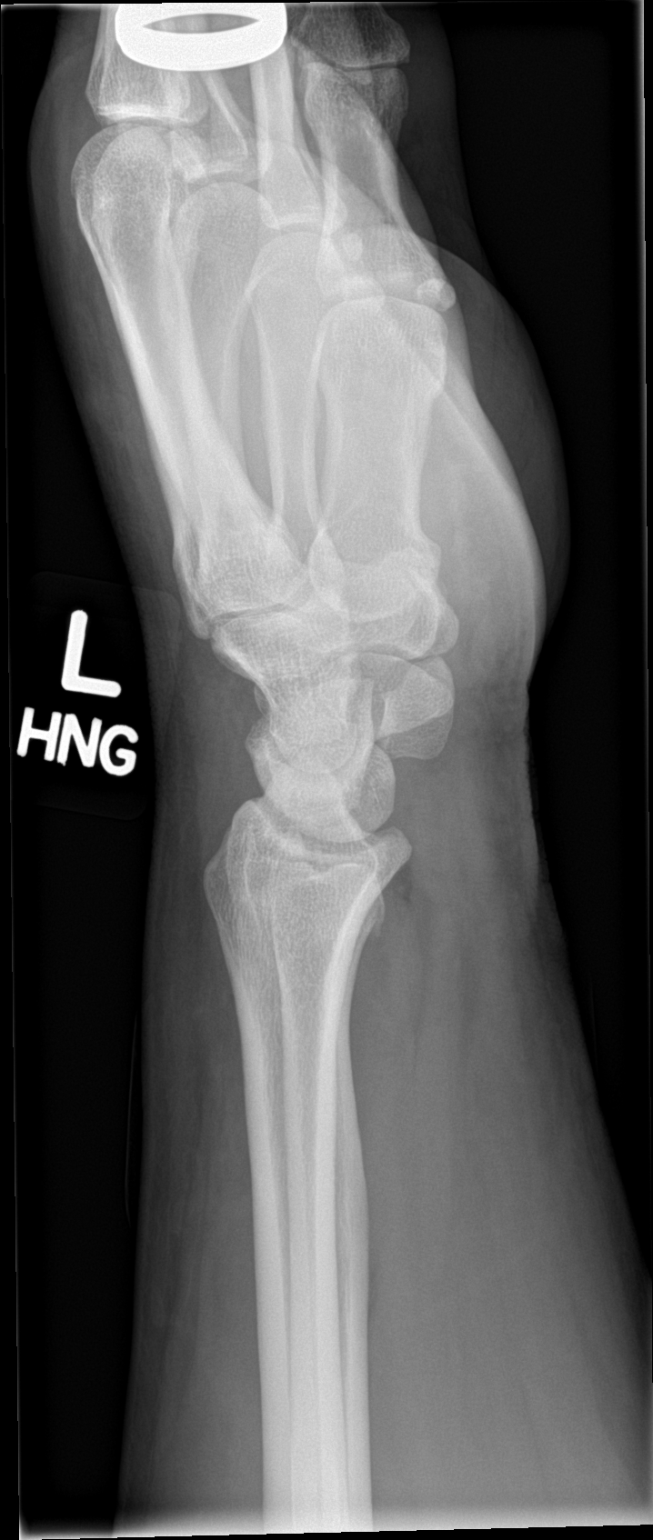
[im 4/4]
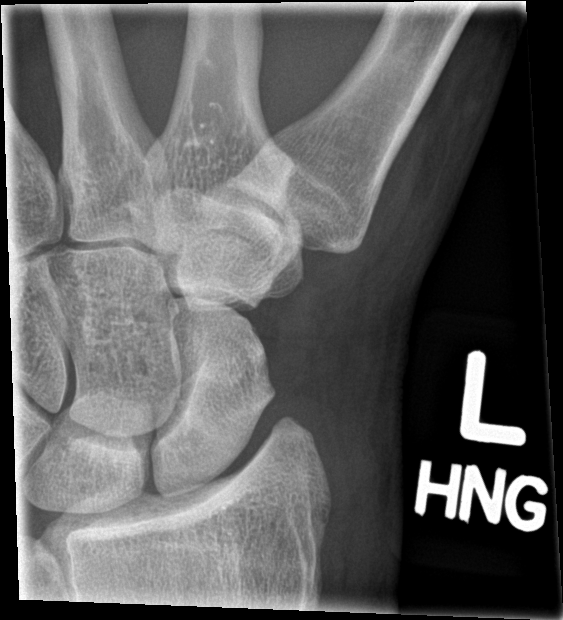

[4 of 4 positions shown; findings below may reference images not displayed]

FINDINGS: There is no evidence of fracture or dislocation. There is no
evidence of arthropathy or other focal bone abnormality. Soft
tissues are unremarkable.
IMPRESSION: Negative.

## 2019-03-25 ENCOUNTER — Other Ambulatory Visit: Payer: Self-pay

## 2019-03-25 ENCOUNTER — Ambulatory Visit (INDEPENDENT_AMBULATORY_CARE_PROVIDER_SITE_OTHER): Payer: Self-pay | Admitting: Urology

## 2019-03-25 VITALS — BP 155/90 | HR 90 | Ht 70.0 in | Wt 260.0 lb

## 2019-03-25 DIAGNOSIS — Z3009 Encounter for other general counseling and advice on contraception: Secondary | ICD-10-CM

## 2019-03-25 MED ORDER — DIAZEPAM 5 MG PO TABS: 5.0000 mg | ORAL_TABLET | Freq: Once | ORAL | 0 refills | Status: AC | PRN

## 2019-03-25 NOTE — Progress Notes (Signed)
03/25/19 12:03 PM   Vernell Morgans September 24, 1965 [id number]  CC: Discuss vasectomy  HPI: I saw Mr. Kady in urology clinic today for vasectomy evaluation.  He is a healthy 53 year old male that is married with 3 children, and his wife is currently pregnant.  His children are age 66, 8, and 41.  Both he and his wife do not desire any further pregnancies.  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   PMH: Past Medical History:  Diagnosis Date  . Asthma     Surgical History: No past surgical history on file.   Allergies: No Known Allergies  Family History: Family History  Problem Relation Age of Onset  . Diabetes Mother   . Hypertension Mother   . Dementia Father   . Hypertension Sister   . Obstructive Sleep Apnea Sister   . Cancer Brother 45       Nasal Cancer  . Lymphoma Brother 59       Lymphoma  . Hypertension Brother   . Prostate cancer Neg Hx   . Colon cancer Neg Hx     Social History:  reports that he has never smoked. He has never used smokeless tobacco. He reports current alcohol use of about 1.0 standard drinks of alcohol per week. He reports that he does not use drugs.  ROS: Please see flowsheet from today's date for complete review of systems.  Physical Exam: BP (!) 155/90   Pulse 90   Ht 5\' 10"  (1.778 m)   Wt 260 lb (117.9 kg)   BMI 37.31 kg/m    Constitutional:  Alert and oriented, No acute distress. Cardiovascular: No clubbing, cyanosis, or edema. Respiratory: Normal respiratory effort, no increased work of breathing. GI: Abdomen is soft, nontender, nondistended, no abdominal masses GU: Thickened cords, vas palpable bilaterally, no testicular masses Lymph: No cervical or inguinal lymphadenopathy. Skin: No rashes, bruises or suspicious lesions. Neurologic: Grossly intact, no focal deficits, moving all 4 extremities. Psychiatric: Normal mood and affect.  Assessment & Plan:   In summary, the patient  is a 53 year old healthy male who desires permanent sterilization with vasectomy.  We discussed the risks and benefits of vasectomy at length.  Vasectomy is intended to be a permanent form of contraception, and does not produce immediate sterility.  Following vasectomy another form of contraception is required until vas occlusion is confirmed by a post-vasectomy semen analysis obtained 2-3 months after the procedure.  Even after vas occlusion is confirmed, vasectomy is not 100% reliable in preventing pregnancy, and the failure rate is approximately 06/1998.  Repeat vasectomy is required in less than 1% of patients.  He should refrain from ejaculation for 1 week after vasectomy.  Options for fertility after vasectomy include vasectomy reversal, and sperm retrieval with in vitro fertilization or ICSI.  These options are not always successful and may be expensive.  Finally, there are other permanent and non-permanent alternatives to vasectomy available. There is no risk of erectile dysfunction, and the volume of semen will be similar to prior, as the majority of the ejaculate is from the prostate and seminal vesicles.   The procedure takes ~20 minutes.  We recommend patients take 5-10 mg of Valium 30 minutes prior, and he will need a driver post-procedure.  Local anesthetic is injected into the scrotal skin and a small segment of the vas deferens is removed, and the ends occluded. The complication rate is approximately 1-2%, and includes bleeding, infection, and development of  chronic scrotal pain.  PLAN: Pending insurance approval, will schedule for vasectomy at his convenience   A total of 30 minutes were spent face-to-face with the patient, greater than 50% was spent in patient education, counseling, and coordination of care regarding vasectomy, risks, benefits, and alternatives.   Billey Co, [facility] [address], [place], [place] [address] [phone]

## 2019-03-25 NOTE — Patient Instructions (Signed)

## 2019-04-08 ENCOUNTER — Ambulatory Visit (INDEPENDENT_AMBULATORY_CARE_PROVIDER_SITE_OTHER): Payer: Self-pay | Admitting: Urology

## 2019-04-08 ENCOUNTER — Other Ambulatory Visit: Payer: Self-pay

## 2019-04-08 ENCOUNTER — Encounter: Payer: Self-pay | Admitting: Urology

## 2019-04-08 VITALS — BP 129/86 | HR 86 | Ht 70.0 in | Wt 260.0 lb

## 2019-04-08 DIAGNOSIS — Z3009 Encounter for other general counseling and advice on contraception: Secondary | ICD-10-CM

## 2019-04-08 NOTE — Patient Instructions (Signed)
Vasectomy, Care After This sheet gives you information about how to care for yourself after your procedure. Your health care provider may also give you more specific instructions. If you have problems or questions, contact your health care provider. What can I expect after the procedure? After your procedure, it is common to have:  Mild pain, swelling, redness, or discomfort in your scrotum.  Some blood coming from your incisions or puncture sites for one or two days.  Blood in your semen. Follow these instructions at home: Medicines   Take over-the-counter and prescription medicines only as told by your health care provider.  Avoid taking NSAIDs such as aspirin and ibuprofen, because these medicines can make bleeding worse. Activity  For the first 2 days after surgery, avoid physical activity and exercise that require a lot of energy. Ask your health care provider what activities are safe for you.  Do not participate in sports or perform heavy physical labor until your pain has improved, or until your health care provider says it is okay.  Do not ejaculate for at least 1 week after the procedure, or as long as directed.  You may resume sexual activity 7-10 days after your procedure, or when your health care provider approves. Use a different method of birth control (contraception) until you have had test results that confirm that there is no sperm in your semen. Scrotal support  Use scrotal support, such as a jock strap or underwear with a supportive pouch, as needed for one week after your procedure.  If you feel discomfort in your scrotum, you may remove the scrotal support to see if the discomfort is relieved. Sometimes scrotal support can press on the scrotum and cause or worsen discomfort.  If your skin gets irritated, you may add some germ-free (sterile), fluffed bandages or a clean washcloth to the scrotal support. General instructions  Put ice on the injured area: ? Put  ice in a plastic bag. ? Place a towel between your skin and the bag. ? Leave the ice on for 20 minutes, 2-3 times a day.  Check your incisions or puncture sites every day for signs of infection. Check for: ? Redness, swelling, or pain. ? Fluid or blood. ? Warmth. ? Pus or a bad smell.  Leave stitches (sutures) in place. The sutures will dissolve on their own and do not need to be removed.  Keep all follow-up visits as told by your health care provider. This is important because you will need a test to confirm that there is no sperm in your semen. Multiple ejaculations are needed to clear out sperm that were beyond the vasectomy site. You will need one test result showing that there is no sperm in your semen before you can resume unprotected sex. This may take 2-4 months after your procedure.  Do not drive for 24 hours if you were given a sedative to help you relax. Contact a health care provider if:  You have redness, swelling, or more pain around your incision or puncture site, or in your scrotum area in general.  You have bleeding from your incision or puncture site.  You have pus or a bad smell coming from your incision or puncture site.  You have a fever.  Your incision or puncture site opens up. Get help right away if:  You develop a rash.  You have difficulty breathing. Summary  After your procedure it is common to have mild pain, swelling, redness, or discomfort in your scrotum.    Avoid physical activity and exercise that requires a lot of energy for the first 2 days after surgery.  Put ice on the injured area. Leave the ice on for 20 minutes, 2-3 times a day.  Do not drive for 24 hours if you were given a sedative to help you relax. This information is not intended to replace advice given to you by your health care provider. Make sure you discuss any questions you have with your health care provider. Document Released: 12/10/2004 Document Revised: 05/05/2017 Document  Reviewed: 08/19/2016 Elsevier Patient Education  2020 Elsevier Inc.  

## 2019-04-08 NOTE — Progress Notes (Signed)
VASECTOMY PROCEDURE NOTE:  The patient was taken to the minor procedure room and placed in the supine position. His genitals were prepped and draped in the usual sterile fashion. The right vas deferens was brought up to the skin of the right upper scrotum. The skin overlying it was anesthetized with 1% lidocaine without epinephrine, anesthetic was also injected alongside the vas deferens in the direction of the inguinal canal. The no scalpel vasectomy instrument was used to make a small perforation in the scrotal skin. The vasectomy clamp was used to grasp the vas deferens. It was carefully dissected free from surrounding structures. A 1cm segment of the vas was removed, and the cut ends of the mucosa were cauterized. A figure of eight suture was used to perform fascial interposition. No significant bleeding was noted. The vas deferens was returned to the scrotum. The skin incision was closed with a simple interrupted stitch of 4-0 chromic.  Attention was then turned to the left side. The left vasectomy was performed in the same exact fashion. Sterile dressings were placed over each incision. The patient tolerated the procedure well.  IMPRESSION/DIAGNOSIS: The patient is a 53 year old gentleman who underwent a vasectomy today. Post-procedure instructions were reviewed. I stressed the importance of continuing to use birth control until he provides a semen specimen more than 2 months from now that demonstrates azoospermia.  We discussed return precautions including fever over 101, significant bleeding or hematoma, or uncontrolled pain. I also stressed the importance of avoiding strenuous activity for one week, no sexual activity or ejaculations for 5 days, intermittent icing over the next 48 hours, and scrotal support.   PLAN: The patient will be advised of his semen analysis results when available.  Nickolas Madrid, MD 04/08/2019

## 2019-05-24 ENCOUNTER — Other Ambulatory Visit: Payer: Self-pay

## 2019-05-24 DIAGNOSIS — Z20822 Contact with and (suspected) exposure to covid-19: Secondary | ICD-10-CM

## 2019-05-25 LAB — NOVEL CORONAVIRUS, NAA: SARS-CoV-2, NAA: NOT DETECTED

## 2019-07-03 ENCOUNTER — Other Ambulatory Visit: Payer: Self-pay

## 2019-07-03 DIAGNOSIS — Z9852 Vasectomy status: Secondary | ICD-10-CM

## 2019-07-03 NOTE — Progress Notes (Signed)
Post

## 2019-07-04 ENCOUNTER — Other Ambulatory Visit: Payer: Self-pay

## 2019-07-12 ENCOUNTER — Telehealth: Payer: Self-pay | Admitting: Urology

## 2019-07-12 NOTE — Telephone Encounter (Signed)
Called pt informed him that the specimen he dropped off was urine and not semen. Advised pt to bring semen sample into office at his earliest convenience. Pt voiced understanding.

## 2019-07-12 NOTE — Telephone Encounter (Signed)
Pt requests a call back to discuss Vas drop off results.

## 2019-07-15 ENCOUNTER — Other Ambulatory Visit: Payer: Self-pay

## 2019-07-15 DIAGNOSIS — Z9852 Vasectomy status: Secondary | ICD-10-CM

## 2019-07-16 LAB — POST-VAS SPERM EVALUATION,QUAL: Volume: 2.2 mL

## 2019-07-17 ENCOUNTER — Telehealth: Payer: Self-pay

## 2019-07-17 NOTE — Telephone Encounter (Signed)
-----   Message from Sondra Come, MD sent at 07/17/2019  8:26 AM EST ----- No sperm seen, okay to discontinue alternative methods of birth control  Legrand Rams, MD 07/17/2019

## 2019-07-17 NOTE — Telephone Encounter (Signed)
Called pt, informed pt of information below. Pt gave verbal understanding.

## 2019-08-26 ENCOUNTER — Ambulatory Visit: Payer: Self-pay | Attending: Internal Medicine

## 2019-08-26 DIAGNOSIS — Z23 Encounter for immunization: Secondary | ICD-10-CM

## 2019-08-26 NOTE — Progress Notes (Signed)
   Covid-19 Vaccination Clinic  Name:  Hector Robbins    MRN: [id number] DOB: 1966-04-03  08/26/2019  Hector Robbins was observed post Covid-19 immunization for 15 minutes without incident. He was provided with Vaccine Information Sheet and instruction to access the V-Safe system.   Hector Robbins was instructed to call 911 with any severe reactions post vaccine: Marland Kitchen Difficulty breathing  . Swelling of face and throat  . A fast heartbeat  . A bad rash all over body  . Dizziness and weakness   Immunizations Administered    Name Date Dose VIS Date Route   Pfizer COVID-19 Vaccine 08/26/2019  2:40 PM 0.3 mL 05/17/2019 Intramuscular   Manufacturer: [employer], [employer]   Lot: [id number]   NDC: [address]-1

## 2019-09-16 ENCOUNTER — Ambulatory Visit: Payer: Self-pay | Attending: Internal Medicine

## 2019-09-16 DIAGNOSIS — Z23 Encounter for immunization: Secondary | ICD-10-CM

## 2019-09-16 NOTE — Progress Notes (Signed)
   Covid-19 Vaccination Clinic  Name:  Hector Robbins    MRN: [id number] DOB: 1966-01-04  09/16/2019  Hector Robbins was observed post Covid-19 immunization for 15 minutes without incident. He was provided with Vaccine Information Sheet and instruction to access the V-Safe system.   Hector Robbins was instructed to call 911 with any severe reactions post vaccine: Marland Kitchen Difficulty breathing  . Swelling of face and throat  . A fast heartbeat  . A bad rash all over body  . Dizziness and weakness   Immunizations Administered    Name Date Dose VIS Date Route   Pfizer COVID-19 Vaccine 09/16/2019  1:38 PM 0.3 mL 05/17/2019 Intramuscular   Manufacturer: [employer], [employer]   Lot: [id number]   NDC: [address]-3

## 2021-11-18 ENCOUNTER — Emergency Department
Admission: EM | Admit: 2021-11-18 | Discharge: 2021-11-18 | Disposition: A | Discharge status: Home / Self Care | Payer: Self-pay | Attending: Emergency Medicine | Admitting: Emergency Medicine

## 2021-11-18 ENCOUNTER — Other Ambulatory Visit: Payer: Self-pay

## 2021-11-18 DIAGNOSIS — G5602 Carpal tunnel syndrome, left upper limb: Secondary | ICD-10-CM | POA: Insufficient documentation

## 2021-11-18 MED ORDER — MELOXICAM 15 MG PO TABS: 15.0000 mg | ORAL_TABLET | Freq: Every day | ORAL | 0 refills | Status: DC

## 2021-11-18 MED ORDER — MELOXICAM 7.5 MG PO TABS
15.0000 mg | ORAL_TABLET | Freq: Once | ORAL | Status: AC
  Administered 2021-11-18: 15 mg via ORAL
  Filled 2021-11-18: qty 2

## 2021-11-18 MED ORDER — MELOXICAM 15 MG PO TABS: 15.0000 mg | ORAL_TABLET | Freq: Every day | ORAL | 0 refills | Status: AC

## 2021-11-18 NOTE — ED Triage Notes (Signed)
Pt to ED for ongoing since 1.5 week intermittent numbness, tingling and burning feeling to L thumb, index and middle finger. Full ROM. States tips of these fingers feel numb. Also gets shooting pain up L arm from L hand when grasps heavy objects. States woke up in middle of night with aching to hand as well.

## 2021-11-18 NOTE — ED Provider Notes (Signed)
[facility] Provider Note  Patient Contact: 4:02 PM (approximate)   History   L hand paresthesia   HPI  Hector Robbins is a 56 y.o. male who presents to the emergency department complaining of tingling and some numbness in the thumb, index and middle finger of the left hand.  Largely nontraumatic.  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  Patient states that he has had pain that woke him up in the middle of the night in the same distribution.  Occasionally will radiate up into the forearm most the time into the wrist into the hand.  No direct trauma to the area.  No complaints at this time.     Physical Exam   Triage Vital Signs: ED Triage Vitals  Enc Vitals Group     BP 11/18/21 1543 (!) 142/83     Pulse Rate 11/18/21 1543 (!) 58     Resp 11/18/21 1543 18     Temp 11/18/21 1543 98.5 F (36.9 C)     Temp Source 11/18/21 1543 Oral     SpO2 11/18/21 1543 96 %     Weight 11/18/21 1543 288 lb (130.6 kg)     Height 11/18/21 1543 5\' 9"  (1.753 m)     Head Circumference --      Peak Flow --      Pain Score 11/18/21 1543 0     Pain Loc --      Pain Edu? --      Excl. in GC? --     Most recent vital signs: Vitals:   11/18/21 1543  BP: (!) 142/83  Pulse: (!) 58  Resp: 18  Temp: 98.5 F (36.9 C)  SpO2: 96%     General: Alert and in no acute distress.  Cardiovascular:  Good peripheral perfusion Respiratory: Normal respiratory effort without tachypnea or retractions. Lungs CTAB.  Musculoskeletal: Full range of motion to all extremities.  Visualization of the left wrist reveals no visible signs of trauma, edema, ecchymosis or open wounds.  Patient has positive Tinel and Phalen's.  No loss of movement, sensation or capillary refill and any of the digits. Neurologic:  No gross focal neurologic deficits are appreciated.  Skin:   No rash noted Other:   ED  Results / Procedures / Treatments   Labs (all labs ordered are listed, but only abnormal results are displayed) Labs Reviewed - No data to display   EKG     RADIOLOGY    No results found.  PROCEDURES:  Critical Care performed: No  Procedures   MEDICATIONS ORDERED IN ED: Medications  meloxicam (MOBIC) tablet 15 mg (has no administration in time range)     IMPRESSION / MDM / ASSESSMENT AND PLAN / ED COURSE  I reviewed the triage vital signs and the nursing notes.                              Differential diagnosis includes, but is not limited to, carpal tunnel, radial nerve impingement, cellulitis, tenosynovitis  Patient's presentation is most consistent with acute illness / injury with system symptoms.   Patient's diagnosis is consistent with carpal tunnel.  Patient presents to the ED complaining of left wrist pain radiating into the left hand.  Patient is some numbness, tingling into the fingers of the thumb,  index and middle finger.  No recent trauma.  No indication for labs or imaging currently.  Given the positive exam for Tinel's and Phalen's I do suspect a component of carpal tunnel.  Patient will be prescribed meloxicam.  Velcro wrist brace administered here in the ED.  Follow-up with orthopedics if symptoms or not improving..  Patient is given ED precautions to return to the ED for any worsening or new symptoms.        FINAL CLINICAL IMPRESSION(S) / ED DIAGNOSES   Final diagnoses:  Carpal tunnel syndrome of left wrist     Rx / DC Orders   ED Discharge Orders          Ordered    meloxicam (MOBIC) 15 MG tablet  Daily        11/18/21 1617             Note:  This document was prepared using Dragon voice recognition software and may include unintentional dictation errors.   Lanette Hampshire 11/18/21 1618    Jene Every, MD 11/18/21 [phone]

## 2022-10-14 ENCOUNTER — Other Ambulatory Visit: Payer: Self-pay

## 2022-10-14 ENCOUNTER — Encounter: Payer: Self-pay | Admitting: Emergency Medicine

## 2022-10-14 ENCOUNTER — Emergency Department: Payer: BLUE CROSS/BLUE SHIELD

## 2022-10-14 ENCOUNTER — Emergency Department
Admission: EM | Admit: 2022-10-14 | Discharge: 2022-10-14 | Disposition: A | Discharge status: Home / Self Care | Payer: Self-pay | Attending: Emergency Medicine | Admitting: Emergency Medicine

## 2022-10-14 DIAGNOSIS — R5383 Other fatigue: Secondary | ICD-10-CM | POA: Insufficient documentation

## 2022-10-14 DIAGNOSIS — Z20822 Contact with and (suspected) exposure to covid-19: Secondary | ICD-10-CM | POA: Insufficient documentation

## 2022-10-14 DIAGNOSIS — R519 Headache, unspecified: Secondary | ICD-10-CM | POA: Insufficient documentation

## 2022-10-14 DIAGNOSIS — H538 Other visual disturbances: Secondary | ICD-10-CM | POA: Insufficient documentation

## 2022-10-14 LAB — SARS CORONAVIRUS 2 BY RT PCR: SARS Coronavirus 2 by RT PCR: NEGATIVE

## 2022-10-14 LAB — CBG MONITORING, ED: Glucose-Capillary: 90 mg/dL (ref 70–99)

## 2022-10-14 NOTE — ED Triage Notes (Signed)
Pt via POV from home. Pt c/o headache, intermittently for the past 3 days. States he also had some fatigue on Tuesday and Wednesday. States that he also having some blurry vision. Denies hx of migraine. Denies head injury. Denies hx of HTN. Denies NV. Pt is A&Ox4 and NAD.

## 2022-10-14 NOTE — ED Provider Triage Note (Signed)
Emergency Medicine Provider Triage Evaluation Note  Hector Robbins , a 57 y.o. male  was evaluated in triage.  Pt complains of HA, frontal headache.positive blurred vision, denies slurred speech, unilateral weakness. .  Review of Systems  Positive: HA, blurred vision Negative: Fever, chills, weakness  Physical Exam  BP 120/82 (BP Location: Left Arm)   Pulse (!) 55   Temp 98.5 F (36.9 C) (Oral)   Resp 18   SpO2 98%  Gen:   Awake, no distress   Resp:  Normal effort  MSK:   Moves extremities without difficulty  Other:    Medical Decision Making  Medically screening exam initiated at 4:54 PM.  Appropriate orders placed.  Hector Robbins was informed that the remainder of the evaluation will be completed by another provider, this initial triage assessment does not replace that evaluation, and the importance of remaining in the ED until their evaluation is complete.  HA, blurred vision. New onset   Hector Robbins 10/14/22 1654

## 2022-10-14 NOTE — ED Provider Notes (Signed)
[facility] Provider Note    Event Date/Time   First MD Initiated Contact with Patient 10/14/22 1819     (approximate)   History   Headache and Blurred Vision   HPI  Hector Robbins is a 57 y.o. male with history of asthma presents emergency department with complaints of a headache for 3 days.  Some fatigue on Tuesday and Wednesday did feel like he might of had a fever.  Some blurred vision.  Large vision comes and goes.  Has not had an eye exam in years, has not had a physical in years.  No history of hypertension.  Family history of diabetes.  No known injury      Physical Exam   Triage Vital Signs: ED Triage Vitals  Enc Vitals Group     BP 10/14/22 1651 120/82     Pulse Rate 10/14/22 1651 (!) 55     Resp 10/14/22 1651 18     Temp 10/14/22 1651 98.5 F (36.9 C)     Temp Source 10/14/22 1651 Oral     SpO2 10/14/22 1651 98 %     Weight 10/14/22 1654 275 lb (124.7 kg)     Height 10/14/22 1654 5\' 9"  (1.753 m)     Head Circumference --      Peak Flow --      Pain Score 10/14/22 1651 6     Pain Loc --      Pain Edu? --      Excl. in GC? --     Most recent vital signs: Vitals:   10/14/22 1651  BP: 120/82  Pulse: (!) 55  Resp: 18  Temp: 98.5 F (36.9 C)  SpO2: 98%     General: Awake, no distress.   CV:  Good peripheral perfusion. regular rate and  rhythm Resp:  Normal effort. Lungs cta Abd:  No distention.   Other:  PERRL, EOMI, frontal sinus mildly tender, neck is supple, no lymphadenopathy   ED Results / Procedures / Treatments   Labs (all labs ordered are listed, but only abnormal results are displayed) Labs Reviewed  SARS CORONAVIRUS 2 BY RT PCR  CBG MONITORING, ED     EKG     RADIOLOGY CT of the head    PROCEDURES:   Procedures   MEDICATIONS ORDERED IN ED: Medications - No data to display   IMPRESSION / MDM / ASSESSMENT AND PLAN / ED COURSE  I reviewed the triage vital signs and the nursing notes.                               Differential diagnosis includes, but is not limited to, CVA, SAH, retinal detachment, diabetes, hypertension, COVID  Patient's presentation is most consistent with Patient's presentation is most consistent with acute presentation with potential threat to life or bodily function.  CT of the head is independently reviewed and interpreted by me as being negative for any acute abnormality  The patient is not consistently blurred, patient's not describing any fluffy clouds, flashing colors, to indicate a severe eye pathology.  Due to the headache and congestion and weakness that he had on Tuesday we will go ahead and test him for COVID, will do a CBG to assess for diabetes if glucose is elevated we will progress to regular lab work  Patient CBG is normal.  COVID test pending.  I do feel the patient needs needs to  get an eye exam and a full physical.  Do not feel that he has had a CVA as he has had no other deficits.  He is to follow-up with his eye doctor, make an appointment with [facility] for full physical.  Return emergency department worsening.  Patient is in agreement treatment plan.  He was discharged stable condition.      FINAL CLINICAL IMPRESSION(S) / ED DIAGNOSES   Final diagnoses:  Blurred vision  Suspected COVID-19 virus infection     Rx / DC Orders   ED Discharge Orders     None        Note:  This document was prepared using Dragon voice recognition software and may include unintentional dictation errors.    Faythe Ghee, PA-C 10/14/22 Aretha Parrot    Trinna Post, MD 10/14/22 [phone]

## 2022-10-14 NOTE — Discharge Instructions (Signed)
Follow-up at [facility] for full physical.  Follow-up with an eye doctor such as [facility] for an eye exam.  Dr. Janett Billow is an optometrist in [place] who is reputable.
